# Patient Record
Sex: Male | Born: 1940 | Race: Black or African American | Hispanic: No | Smoking: Current every day smoker
Health system: Southern US, Community
[De-identification: ages and names within clinical notes are randomized; demographics above are authoritative.]

## PROBLEM LIST (undated history)

## (undated) DIAGNOSIS — C801 Malignant (primary) neoplasm, unspecified: Secondary | ICD-10-CM

## (undated) HISTORY — PX: TONSILLECTOMY: SUR1361

---

## 2016-01-23 HISTORY — PX: COLON SURGERY: SHX602

## 2017-05-28 ENCOUNTER — Encounter (HOSPITAL_COMMUNITY): Payer: Self-pay | Admitting: Emergency Medicine

## 2017-05-28 ENCOUNTER — Emergency Department (HOSPITAL_COMMUNITY)
Admission: EM | Admit: 2017-05-28 | Discharge: 2017-05-28 | Disposition: A | Discharge status: Home / Self Care | Payer: Medicare Other | Attending: Emergency Medicine | Admitting: Emergency Medicine

## 2017-05-28 DIAGNOSIS — E876 Hypokalemia: Secondary | ICD-10-CM | POA: Diagnosis not present

## 2017-05-28 DIAGNOSIS — Z85038 Personal history of other malignant neoplasm of large intestine: Secondary | ICD-10-CM | POA: Diagnosis not present

## 2017-05-28 DIAGNOSIS — Z79899 Other long term (current) drug therapy: Secondary | ICD-10-CM | POA: Diagnosis not present

## 2017-05-28 DIAGNOSIS — R001 Bradycardia, unspecified: Secondary | ICD-10-CM | POA: Insufficient documentation

## 2017-05-28 DIAGNOSIS — Z5181 Encounter for therapeutic drug level monitoring: Secondary | ICD-10-CM | POA: Diagnosis present

## 2017-05-28 HISTORY — DX: Malignant (primary) neoplasm, unspecified: C80.1

## 2017-05-28 LAB — MAGNESIUM: MAGNESIUM: 2.1 mg/dL (ref 1.7–2.4)

## 2017-05-28 LAB — BASIC METABOLIC PANEL
ANION GAP: 8 (ref 5–15)
BUN: 23 mg/dL — ABNORMAL HIGH (ref 6–20)
CHLORIDE: 113 mmol/L — AB (ref 101–111)
CO2: 24 mmol/L (ref 22–32)
Calcium: 8.6 mg/dL — ABNORMAL LOW (ref 8.9–10.3)
Creatinine, Ser: 1.14 mg/dL (ref 0.61–1.24)
GFR calc non Af Amer: >60 mL/min (ref >60)
Glucose, Bld: 90 mg/dL (ref 65–99)
POTASSIUM: 2.6 mmol/L — AB (ref 3.5–5.1)
SODIUM: 145 mmol/L (ref 135–145)

## 2017-05-28 MED ORDER — POTASSIUM CHLORIDE ER 20 MEQ PO TBCR: 20.0000 meq | EXTENDED_RELEASE_TABLET | Freq: Two times a day (BID) | ORAL | 0 refills | Status: AC

## 2017-05-28 MED ORDER — POTASSIUM CHLORIDE CRYS ER 20 MEQ PO TBCR
40.0000 meq | EXTENDED_RELEASE_TABLET | Freq: Once | ORAL | Status: AC
  Administered 2017-05-28: 40 meq via ORAL
  Filled 2017-05-28: qty 2

## 2017-05-28 MED ORDER — POTASSIUM CHLORIDE 10 MEQ/100ML IV SOLN
10.0000 meq | INTRAVENOUS | Status: AC
  Administered 2017-05-28: 10 meq via INTRAVENOUS
  Filled 2017-05-28: qty 100

## 2017-05-28 MED ORDER — HEPARIN SOD (PORK) LOCK FLUSH 100 UNIT/ML IV SOLN
500.0000 [IU] | Freq: Once | INTRAVENOUS | Status: AC
Start: 2017-05-28 — End: 2017-05-28
  Administered 2017-05-28: 500 [IU]
  Filled 2017-05-28: qty 5

## 2017-05-28 MED ORDER — POTASSIUM CHLORIDE 10 MEQ/100ML IV SOLN
10.0000 meq | Freq: Once | INTRAVENOUS | Status: AC
Start: 2017-05-28 — End: 2017-05-28
  Administered 2017-05-28: 10 meq via INTRAVENOUS
  Filled 2017-05-28: qty 100

## 2017-05-28 NOTE — ED Triage Notes (Signed)
Patient was seen at PCP last Friday and potassium was low and given potassium there but never got the potassium that was supposed to be ordered for at home. Since doesn't have a PCP in [place] they had to come here for potasium level to be checked. Family wanting recommendations for PCP in area.

## 2017-05-28 NOTE — ED Notes (Signed)
Bed: WA03 Expected date:  Expected time:  Means of arrival:  Comments: Triage 2 

## 2017-05-28 NOTE — ED Provider Notes (Signed)
[facility] DEPT Provider Note   CSN: [id number] Arrival date & time: 05/28/17  1014     History   Chief Complaint Chief Complaint  Patient presents with  . postassium level check    HPI Scott Harper is a 77 y.o. male.  HPI Patient has a history of colon cancer.  Presents for potassium recheck.  Reportedly was due to get chemotherapy at [facility] on Friday with today being Tuesday and he would not get done because his potassium was low.  Patient and family member did not know what the actual number was however.  States that he got some through the IV and then got 2 pills.  States he had a prescription for it but they went to the pharmacy and said it was never called in so he has not been taking more.  Does not have a primary care doctor here.  Patient states he feels fine.  No muscle cramps.  No lightheadedness or dizziness. Past Medical History:  Diagnosis Date  . Cancer [facility])    colon     There are no active problems to display for this patient.   History reviewed. No pertinent surgical history.      Home Medications    Prior to Admission medications   Medication Sig Start Date End Date Taking? Authorizing Provider  dexamethasone (DECADRON) 2 MG tablet Take 4 mg by mouth as directed. Take for 2 days after chemo   Yes [provider]  mirtazapine (REMERON) 30 MG tablet Take 30 mg by mouth at bedtime.   Yes [provider]  potassium chloride 20 MEQ TBCR Take 20 mEq by mouth 2 (two) times daily. 05/28/17   Davonna Belling, MD    Family History No family history on file.  Social History Social History   Tobacco Use  . Smoking status: Not on file  Substance Use Topics  . Alcohol use: Not on file  . Drug use: Not on file     Allergies   Patient has no known allergies.   Review of Systems Review of Systems  Constitutional: Negative for appetite change.  Respiratory: Negative for shortness of breath.     Cardiovascular: Negative for chest pain.  Gastrointestinal: Negative for abdominal pain.  Endocrine: Negative for polyuria.  Musculoskeletal: Negative for myalgias.  Skin: Negative for rash.     Physical Exam Updated Vital Signs BP (!) 178/87   Pulse (!) 38   Temp 98 F (36.7 C) (Oral)   Resp 14   SpO2 100%   Physical Exam  Constitutional: He appears well-developed.  HENT:  Head: Normocephalic.  Eyes: Pupils are equal, round, and reactive to light.  Neck: Neck supple.  Cardiovascular: Normal rate.  Pulmonary/Chest:  Port-A-Cath right chest wall.  Abdominal: Soft. There is no tenderness.  Musculoskeletal: He exhibits no edema.  Neurological: He is alert.  Skin: Skin is warm. Capillary refill takes less than 2 seconds.  Psychiatric: He has a normal mood and affect.     ED Treatments / Results  Labs (all labs ordered are listed, but only abnormal results are displayed) Labs Reviewed  BASIC METABOLIC PANEL - Abnormal; Notable for the following components:      Result Value   Potassium 2.6 (*)    Chloride 113 (*)    BUN 23 (*)    Calcium 8.6 (*)    All other components within normal limits  MAGNESIUM    EKG EKG Interpretation  Date/Time:  Tuesday May 28 2017 11:41:29 EDT Ventricular Rate:  39 PR Interval:    QRS Duration: 84 QT Interval:  526 QTC Calculation: 424 R Axis:   58 Text Interpretation:  Sinus bradycardia Anterior infarct, old Nonspecific T abnormalities, lateral leads No old tracing to compare Confirmed by Davonna Belling [phone]) on 05/28/2017 12:38:12 PM   Radiology No results found.  Procedures Procedures (including critical care time)  Medications Ordered in ED Medications  potassium chloride 10 mEq in 100 mL IVPB (10 mEq Intravenous New Bag/Given 05/28/17 1519)  potassium chloride SA (K-DUR,KLOR-CON) CR tablet 40 mEq (40 mEq Oral Given 05/28/17 1518)     Initial Impression / Assessment and Plan / ED Course  I have reviewed the triage  vital signs and the nursing notes.  Pertinent labs & imaging results that were available during my care of the patient were reviewed by me and considered in my medical decision making (see chart for details).     Patient with hypokalemia.  Has not had oral supplementation.  Given 2 rounds of IV and oral potassium.  Also given prescription for home.  Has bradycardia here.  Has been asymptomatic.  No lightheadedness or dizziness.  Not on any rate reducing medications.  States he has had slow heart rate in the past.  Doubt this is related to the hypokalemia.  Appears to be chronic.  Likely discharge home care will be turned over to Dr. Gilford Raid.  Final Clinical Impressions(s) / ED Diagnoses   Final diagnoses:  Hypokalemia  Sinus bradycardia    ED Discharge Orders        Ordered    potassium chloride 20 MEQ TBCR  2 times daily     05/28/17 1613       Davonna Belling, MD 05/28/17 1614

## 2017-05-28 NOTE — ED Provider Notes (Signed)
Pt signed out from Dr. Alvino Chapel pending potassium infusion.  Pt is bradycardic, but said he is always bradycardic and is asymptomatic from it.  The pt is feeling better after the potassium.  He has an appt with [facility] on Friday, the 10th.  He is given a rx for potassium.  Return if worse.   Isla Pence, MD 05/28/17 [phone]

## 2017-05-28 NOTE — ED Notes (Addendum)
Date and time results received: 05/28/17 11:34 AM  Test: Potassium Critical Value: 2.6  Name of Provider Notified: Alvino Chapel

## 2017-05-28 NOTE — Discharge Instructions (Signed)
Follow-up with your doctors for your potassium level.  It was 2.6 today.

## 2017-05-28 NOTE — ED Notes (Signed)
Made Dr pickering aware that wanting an update. Will be coming to speak with patient

## 2018-01-31 ENCOUNTER — Emergency Department (HOSPITAL_COMMUNITY)
Admission: EM | Admit: 2018-01-31 | Discharge: 2018-02-01 | Disposition: A | Discharge status: Home / Self Care | Payer: Medicare Other | Attending: Emergency Medicine | Admitting: Emergency Medicine

## 2018-01-31 ENCOUNTER — Encounter (HOSPITAL_COMMUNITY): Payer: Self-pay

## 2018-01-31 ENCOUNTER — Other Ambulatory Visit: Payer: Self-pay

## 2018-01-31 DIAGNOSIS — Z9049 Acquired absence of other specified parts of digestive tract: Secondary | ICD-10-CM | POA: Diagnosis not present

## 2018-01-31 DIAGNOSIS — C189 Malignant neoplasm of colon, unspecified: Secondary | ICD-10-CM | POA: Insufficient documentation

## 2018-01-31 DIAGNOSIS — Z452 Encounter for adjustment and management of vascular access device: Secondary | ICD-10-CM

## 2018-01-31 DIAGNOSIS — Z79899 Other long term (current) drug therapy: Secondary | ICD-10-CM | POA: Diagnosis not present

## 2018-01-31 DIAGNOSIS — C787 Secondary malignant neoplasm of liver and intrahepatic bile duct: Secondary | ICD-10-CM | POA: Diagnosis not present

## 2018-01-31 DIAGNOSIS — G629 Polyneuropathy, unspecified: Secondary | ICD-10-CM | POA: Diagnosis not present

## 2018-01-31 DIAGNOSIS — I10 Essential (primary) hypertension: Secondary | ICD-10-CM | POA: Diagnosis not present

## 2018-01-31 DIAGNOSIS — E860 Dehydration: Secondary | ICD-10-CM | POA: Diagnosis not present

## 2018-01-31 DIAGNOSIS — C19 Malignant neoplasm of rectosigmoid junction: Secondary | ICD-10-CM | POA: Diagnosis not present

## 2018-01-31 DIAGNOSIS — R109 Unspecified abdominal pain: Secondary | ICD-10-CM | POA: Diagnosis not present

## 2018-01-31 DIAGNOSIS — Z5111 Encounter for antineoplastic chemotherapy: Secondary | ICD-10-CM | POA: Diagnosis not present

## 2018-01-31 DIAGNOSIS — E876 Hypokalemia: Secondary | ICD-10-CM | POA: Diagnosis not present

## 2018-01-31 NOTE — Discharge Instructions (Addendum)
Return to the emergency department have any issues with your Port-A-Cath.

## 2018-01-31 NOTE — ED Notes (Signed)
Bed: WTR5 Expected date:  Expected time:  Means of arrival:  Comments: 

## 2018-01-31 NOTE — ED Triage Notes (Addendum)
Pt reports that he left the cancer center earlier today and about 1 hour ago started bleeding from his port. Blood noted in the line. IV team consulted. No pain or complaints.

## 2018-01-31 NOTE — Progress Notes (Signed)
Patient has his port accessed for continuous chemotherapy via [employer] infusion pump for home that was started today 01/31/18 at [facility] where the patient is seen by [facility]. The home infusion was connected to the ports lumen by a blue valve that had become malfunctioned and leaking. Blood was backed up in port tubing. The blue valve was disconnected from the patient and attempted to be flushed but was unable to due to a clot that was formed inside the valve. The valve and attachment piece was placed in a double biohazard bag and given to the patient. Port was flush with no resistance or discomfort to the patient. Pump infusion line was connected to the port line and the home pump was restarted. Pt and family instructed to follow-up with [facility]. [facility] was attempted to be contacted with no success. Triage RN made aware.  Lerry Liner, RN,VAST

## 2018-01-31 NOTE — ED Notes (Addendum)
Went into room with IV team. Device attached to the line that, when this writer attempted to flush it, noted there is a clot in it causing the leaking. Small device removed and IV team hooked chemo pump directly to port line. Running normally now. IV team to contact [facility] for more information.

## 2018-01-31 NOTE — ED Provider Notes (Signed)
[facility] DEPT Provider Note   CSN: [id number] Arrival date & time: 01/31/18  2151     History   Chief Complaint Chief Complaint  Patient presents with  . Vascular Access Problem    HPI Scott Harper is a 78 y.o. male.  He was currently being treated at [facility] with parenteral antibiotics.  He came in today because he was bleeding from his port.  The IV team assessed it and took off a piece that was moderate and causing him to bleed.  Bleeding has resolved and his chemotherapy is running appropriately.  No other complaints at this time  HPI  Past Medical History:  Diagnosis Date  . Cancer [facility])    colon     There are no active problems to display for this patient.   History reviewed. No pertinent surgical history.      Home Medications    Prior to Admission medications   Medication Sig Start Date End Date Taking? Authorizing Provider  dexamethasone (DECADRON) 2 MG tablet Take 4 mg by mouth as directed. Take for 2 days after chemo    [provider]  mirtazapine (REMERON) 30 MG tablet Take 30 mg by mouth at bedtime.    [provider]  potassium chloride 20 MEQ TBCR Take 20 mEq by mouth 2 (two) times daily. 05/28/17   Davonna Belling, MD    Family History History reviewed. No pertinent family history.  Social History Social History   Tobacco Use  . Smoking status: Not on file  Substance Use Topics  . Alcohol use: Not on file  . Drug use: Not on file     Allergies   Patient has no known allergies.   Review of Systems Review of Systems Ten systems reviewed and are negative for acute change, except as noted in the HPI.    Physical Exam Updated Vital Signs BP (!) 163/91 (BP Location: Right Arm)   Pulse 60   Temp 98 F (36.7 C) (Oral)   Resp 15   Ht 5\' 8"  (1.727 m)   Wt 59 kg   SpO2 100%   BMI 19.77 kg/m   Physical Exam Vitals signs and nursing note reviewed.  Constitutional:      General: He is  not in acute distress.    Appearance: He is well-developed. He is not diaphoretic.  HENT:     Head: Normocephalic and atraumatic.  Eyes:     General: No scleral icterus.    Conjunctiva/sclera: Conjunctivae normal.  Neck:     Musculoskeletal: Normal range of motion and neck supple.  Cardiovascular:     Rate and Rhythm: Normal rate and regular rhythm.     Heart sounds: Normal heart sounds.  Pulmonary:     Effort: Pulmonary effort is normal. No respiratory distress.     Breath sounds: Normal breath sounds.     Comments: Order cath in the right subclavian region.  No active bleeding.  Appears to be functioning appropriately Abdominal:     Palpations: Abdomen is soft.     Tenderness: There is no abdominal tenderness.  Skin:    General: Skin is warm and dry.  Neurological:     Mental Status: He is alert.  Psychiatric:        Behavior: Behavior normal.      ED Treatments / Results  Labs (all labs ordered are listed, but only abnormal results are displayed) Labs Reviewed - No data to display  EKG None  Radiology  No results found.  Procedures Procedures (including critical care time)  Medications Ordered in ED Medications - No data to display   Initial Impression / Assessment and Plan / ED Course  I have reviewed the triage vital signs and the nursing notes.  Pertinent labs & imaging results that were available during my care of the patient were reviewed by me and considered in my medical decision making (see chart for details).     The cath, resolved after intervention.  His chemotherapy agents are running appropriately he may follow-up with [facility].  Discussed return precautions.  Final Clinical Impressions(s) / ED Diagnoses   Final diagnoses:  Encounter for care related to [facility]    ED Discharge Orders    None       Margarita Mail, PA-C 02/01/18 1224    Tegeler, Gwenyth Allegra, MD 02/01/18 1046

## 2018-02-14 DIAGNOSIS — E876 Hypokalemia: Secondary | ICD-10-CM | POA: Diagnosis not present

## 2018-02-14 DIAGNOSIS — R109 Unspecified abdominal pain: Secondary | ICD-10-CM | POA: Diagnosis not present

## 2018-02-14 DIAGNOSIS — C189 Malignant neoplasm of colon, unspecified: Secondary | ICD-10-CM | POA: Diagnosis not present

## 2018-02-14 DIAGNOSIS — Z9049 Acquired absence of other specified parts of digestive tract: Secondary | ICD-10-CM | POA: Diagnosis not present

## 2018-02-14 DIAGNOSIS — C182 Malignant neoplasm of ascending colon: Secondary | ICD-10-CM | POA: Diagnosis not present

## 2018-02-14 DIAGNOSIS — G629 Polyneuropathy, unspecified: Secondary | ICD-10-CM | POA: Diagnosis not present

## 2018-02-14 DIAGNOSIS — I10 Essential (primary) hypertension: Secondary | ICD-10-CM | POA: Diagnosis not present

## 2018-02-14 DIAGNOSIS — G620 Drug-induced polyneuropathy: Secondary | ICD-10-CM | POA: Diagnosis not present

## 2018-02-14 DIAGNOSIS — T451X5A Adverse effect of antineoplastic and immunosuppressive drugs, initial encounter: Secondary | ICD-10-CM | POA: Diagnosis not present

## 2018-02-14 DIAGNOSIS — C787 Secondary malignant neoplasm of liver and intrahepatic bile duct: Secondary | ICD-10-CM | POA: Diagnosis not present

## 2018-02-14 DIAGNOSIS — Z5111 Encounter for antineoplastic chemotherapy: Secondary | ICD-10-CM | POA: Diagnosis not present

## 2018-03-07 DIAGNOSIS — R079 Chest pain, unspecified: Secondary | ICD-10-CM | POA: Diagnosis not present

## 2018-03-07 DIAGNOSIS — Z9049 Acquired absence of other specified parts of digestive tract: Secondary | ICD-10-CM | POA: Diagnosis not present

## 2018-03-07 DIAGNOSIS — C787 Secondary malignant neoplasm of liver and intrahepatic bile duct: Secondary | ICD-10-CM | POA: Diagnosis not present

## 2018-03-07 DIAGNOSIS — E876 Hypokalemia: Secondary | ICD-10-CM | POA: Diagnosis not present

## 2018-03-07 DIAGNOSIS — R918 Other nonspecific abnormal finding of lung field: Secondary | ICD-10-CM | POA: Diagnosis not present

## 2018-03-07 DIAGNOSIS — Z79899 Other long term (current) drug therapy: Secondary | ICD-10-CM | POA: Diagnosis not present

## 2018-03-07 DIAGNOSIS — C189 Malignant neoplasm of colon, unspecified: Secondary | ICD-10-CM | POA: Diagnosis not present

## 2018-03-07 DIAGNOSIS — M549 Dorsalgia, unspecified: Secondary | ICD-10-CM | POA: Diagnosis not present

## 2018-03-07 DIAGNOSIS — R109 Unspecified abdominal pain: Secondary | ICD-10-CM | POA: Diagnosis not present

## 2018-03-07 DIAGNOSIS — I10 Essential (primary) hypertension: Secondary | ICD-10-CM | POA: Diagnosis not present

## 2018-03-07 DIAGNOSIS — C19 Malignant neoplasm of rectosigmoid junction: Secondary | ICD-10-CM | POA: Diagnosis not present

## 2018-03-07 DIAGNOSIS — G629 Polyneuropathy, unspecified: Secondary | ICD-10-CM | POA: Diagnosis not present

## 2018-03-07 DIAGNOSIS — R590 Localized enlarged lymph nodes: Secondary | ICD-10-CM | POA: Diagnosis not present

## 2018-03-21 DIAGNOSIS — Z515 Encounter for palliative care: Secondary | ICD-10-CM | POA: Diagnosis not present

## 2018-03-21 DIAGNOSIS — Z79899 Other long term (current) drug therapy: Secondary | ICD-10-CM | POA: Diagnosis not present

## 2018-03-21 DIAGNOSIS — G620 Drug-induced polyneuropathy: Secondary | ICD-10-CM | POA: Diagnosis not present

## 2018-03-21 DIAGNOSIS — R590 Localized enlarged lymph nodes: Secondary | ICD-10-CM | POA: Diagnosis not present

## 2018-03-21 DIAGNOSIS — E876 Hypokalemia: Secondary | ICD-10-CM | POA: Diagnosis not present

## 2018-03-21 DIAGNOSIS — Z6821 Body mass index (BMI) 21.0-21.9, adult: Secondary | ICD-10-CM | POA: Diagnosis not present

## 2018-03-21 DIAGNOSIS — C189 Malignant neoplasm of colon, unspecified: Secondary | ICD-10-CM | POA: Diagnosis not present

## 2018-03-21 DIAGNOSIS — Z5111 Encounter for antineoplastic chemotherapy: Secondary | ICD-10-CM | POA: Diagnosis not present

## 2018-03-21 DIAGNOSIS — T402X5A Adverse effect of other opioids, initial encounter: Secondary | ICD-10-CM | POA: Diagnosis not present

## 2018-03-21 DIAGNOSIS — R918 Other nonspecific abnormal finding of lung field: Secondary | ICD-10-CM | POA: Diagnosis not present

## 2018-03-21 DIAGNOSIS — C787 Secondary malignant neoplasm of liver and intrahepatic bile duct: Secondary | ICD-10-CM | POA: Diagnosis not present

## 2018-03-21 DIAGNOSIS — Z9049 Acquired absence of other specified parts of digestive tract: Secondary | ICD-10-CM | POA: Diagnosis not present

## 2018-03-21 DIAGNOSIS — I10 Essential (primary) hypertension: Secondary | ICD-10-CM | POA: Diagnosis not present

## 2018-03-21 DIAGNOSIS — C19 Malignant neoplasm of rectosigmoid junction: Secondary | ICD-10-CM | POA: Diagnosis not present

## 2018-03-21 DIAGNOSIS — K5903 Drug induced constipation: Secondary | ICD-10-CM | POA: Diagnosis not present

## 2018-03-21 DIAGNOSIS — G893 Neoplasm related pain (acute) (chronic): Secondary | ICD-10-CM | POA: Diagnosis not present

## 2018-03-21 DIAGNOSIS — T451X5A Adverse effect of antineoplastic and immunosuppressive drugs, initial encounter: Secondary | ICD-10-CM | POA: Diagnosis not present

## 2018-03-23 DIAGNOSIS — C189 Malignant neoplasm of colon, unspecified: Secondary | ICD-10-CM | POA: Diagnosis not present

## 2018-04-04 DIAGNOSIS — E876 Hypokalemia: Secondary | ICD-10-CM | POA: Diagnosis not present

## 2018-04-04 DIAGNOSIS — C787 Secondary malignant neoplasm of liver and intrahepatic bile duct: Secondary | ICD-10-CM | POA: Diagnosis not present

## 2018-04-04 DIAGNOSIS — C189 Malignant neoplasm of colon, unspecified: Secondary | ICD-10-CM | POA: Diagnosis not present

## 2018-04-04 DIAGNOSIS — M542 Cervicalgia: Secondary | ICD-10-CM | POA: Diagnosis not present

## 2018-04-04 DIAGNOSIS — C19 Malignant neoplasm of rectosigmoid junction: Secondary | ICD-10-CM | POA: Diagnosis not present

## 2018-04-04 DIAGNOSIS — R7989 Other specified abnormal findings of blood chemistry: Secondary | ICD-10-CM | POA: Diagnosis not present

## 2018-04-04 DIAGNOSIS — Z87891 Personal history of nicotine dependence: Secondary | ICD-10-CM | POA: Diagnosis not present

## 2018-04-04 DIAGNOSIS — K5900 Constipation, unspecified: Secondary | ICD-10-CM | POA: Diagnosis not present

## 2018-04-04 DIAGNOSIS — Z9049 Acquired absence of other specified parts of digestive tract: Secondary | ICD-10-CM | POA: Diagnosis not present

## 2018-04-04 DIAGNOSIS — Z5111 Encounter for antineoplastic chemotherapy: Secondary | ICD-10-CM | POA: Diagnosis not present

## 2018-04-04 DIAGNOSIS — G629 Polyneuropathy, unspecified: Secondary | ICD-10-CM | POA: Diagnosis not present

## 2018-04-06 DIAGNOSIS — C189 Malignant neoplasm of colon, unspecified: Secondary | ICD-10-CM | POA: Diagnosis not present

## 2018-04-18 DIAGNOSIS — C787 Secondary malignant neoplasm of liver and intrahepatic bile duct: Secondary | ICD-10-CM | POA: Diagnosis not present

## 2018-04-18 DIAGNOSIS — Z5111 Encounter for antineoplastic chemotherapy: Secondary | ICD-10-CM | POA: Diagnosis not present

## 2018-04-18 DIAGNOSIS — C189 Malignant neoplasm of colon, unspecified: Secondary | ICD-10-CM | POA: Diagnosis not present

## 2018-04-20 DIAGNOSIS — C189 Malignant neoplasm of colon, unspecified: Secondary | ICD-10-CM | POA: Diagnosis not present

## 2018-05-09 DIAGNOSIS — Z5111 Encounter for antineoplastic chemotherapy: Secondary | ICD-10-CM | POA: Diagnosis not present

## 2018-05-09 DIAGNOSIS — C189 Malignant neoplasm of colon, unspecified: Secondary | ICD-10-CM | POA: Diagnosis not present

## 2018-05-09 DIAGNOSIS — C787 Secondary malignant neoplasm of liver and intrahepatic bile duct: Secondary | ICD-10-CM | POA: Diagnosis not present

## 2018-05-11 DIAGNOSIS — C189 Malignant neoplasm of colon, unspecified: Secondary | ICD-10-CM | POA: Diagnosis not present

## 2018-06-06 DIAGNOSIS — R109 Unspecified abdominal pain: Secondary | ICD-10-CM | POA: Diagnosis not present

## 2018-06-06 DIAGNOSIS — Z9049 Acquired absence of other specified parts of digestive tract: Secondary | ICD-10-CM | POA: Diagnosis not present

## 2018-06-06 DIAGNOSIS — C189 Malignant neoplasm of colon, unspecified: Secondary | ICD-10-CM | POA: Diagnosis not present

## 2018-06-06 DIAGNOSIS — E876 Hypokalemia: Secondary | ICD-10-CM | POA: Diagnosis not present

## 2018-06-06 DIAGNOSIS — Z5111 Encounter for antineoplastic chemotherapy: Secondary | ICD-10-CM | POA: Diagnosis not present

## 2018-06-06 DIAGNOSIS — G620 Drug-induced polyneuropathy: Secondary | ICD-10-CM | POA: Diagnosis not present

## 2018-06-06 DIAGNOSIS — I10 Essential (primary) hypertension: Secondary | ICD-10-CM | POA: Diagnosis not present

## 2018-06-06 DIAGNOSIS — G629 Polyneuropathy, unspecified: Secondary | ICD-10-CM | POA: Diagnosis not present

## 2018-06-06 DIAGNOSIS — K5900 Constipation, unspecified: Secondary | ICD-10-CM | POA: Diagnosis not present

## 2018-06-06 DIAGNOSIS — T451X5A Adverse effect of antineoplastic and immunosuppressive drugs, initial encounter: Secondary | ICD-10-CM | POA: Diagnosis not present

## 2018-06-06 DIAGNOSIS — C787 Secondary malignant neoplasm of liver and intrahepatic bile duct: Secondary | ICD-10-CM | POA: Diagnosis not present

## 2018-06-08 DIAGNOSIS — C189 Malignant neoplasm of colon, unspecified: Secondary | ICD-10-CM | POA: Diagnosis not present

## 2018-06-18 DIAGNOSIS — H25813 Combined forms of age-related cataract, bilateral: Secondary | ICD-10-CM | POA: Diagnosis not present

## 2018-06-20 DIAGNOSIS — C787 Secondary malignant neoplasm of liver and intrahepatic bile duct: Secondary | ICD-10-CM | POA: Diagnosis not present

## 2018-06-20 DIAGNOSIS — Z5111 Encounter for antineoplastic chemotherapy: Secondary | ICD-10-CM | POA: Diagnosis not present

## 2018-06-20 DIAGNOSIS — C189 Malignant neoplasm of colon, unspecified: Secondary | ICD-10-CM | POA: Diagnosis not present

## 2018-06-22 DIAGNOSIS — C189 Malignant neoplasm of colon, unspecified: Secondary | ICD-10-CM | POA: Diagnosis not present

## 2018-07-04 DIAGNOSIS — R51 Headache: Secondary | ICD-10-CM | POA: Diagnosis not present

## 2018-07-04 DIAGNOSIS — Z5111 Encounter for antineoplastic chemotherapy: Secondary | ICD-10-CM | POA: Diagnosis not present

## 2018-07-04 DIAGNOSIS — C787 Secondary malignant neoplasm of liver and intrahepatic bile duct: Secondary | ICD-10-CM | POA: Diagnosis not present

## 2018-07-04 DIAGNOSIS — C189 Malignant neoplasm of colon, unspecified: Secondary | ICD-10-CM | POA: Diagnosis not present

## 2018-07-06 DIAGNOSIS — C189 Malignant neoplasm of colon, unspecified: Secondary | ICD-10-CM | POA: Diagnosis not present

## 2018-07-17 ENCOUNTER — Telehealth: Payer: Self-pay

## 2018-07-18 DIAGNOSIS — K5900 Constipation, unspecified: Secondary | ICD-10-CM | POA: Diagnosis not present

## 2018-07-18 DIAGNOSIS — Z66 Do not resuscitate: Secondary | ICD-10-CM | POA: Diagnosis not present

## 2018-07-18 DIAGNOSIS — C189 Malignant neoplasm of colon, unspecified: Secondary | ICD-10-CM | POA: Diagnosis not present

## 2018-07-18 DIAGNOSIS — C787 Secondary malignant neoplasm of liver and intrahepatic bile duct: Secondary | ICD-10-CM | POA: Diagnosis not present

## 2018-07-18 DIAGNOSIS — Z79899 Other long term (current) drug therapy: Secondary | ICD-10-CM | POA: Diagnosis not present

## 2018-07-18 DIAGNOSIS — I10 Essential (primary) hypertension: Secondary | ICD-10-CM | POA: Diagnosis not present

## 2018-07-18 DIAGNOSIS — R918 Other nonspecific abnormal finding of lung field: Secondary | ICD-10-CM | POA: Diagnosis not present

## 2018-07-18 DIAGNOSIS — C19 Malignant neoplasm of rectosigmoid junction: Secondary | ICD-10-CM | POA: Diagnosis not present

## 2018-07-18 DIAGNOSIS — R160 Hepatomegaly, not elsewhere classified: Secondary | ICD-10-CM | POA: Diagnosis not present

## 2018-07-18 DIAGNOSIS — Z9049 Acquired absence of other specified parts of digestive tract: Secondary | ICD-10-CM | POA: Diagnosis not present

## 2018-07-18 DIAGNOSIS — E876 Hypokalemia: Secondary | ICD-10-CM | POA: Diagnosis not present

## 2018-07-18 DIAGNOSIS — R51 Headache: Secondary | ICD-10-CM | POA: Diagnosis not present

## 2018-07-18 DIAGNOSIS — G629 Polyneuropathy, unspecified: Secondary | ICD-10-CM | POA: Diagnosis not present

## 2018-07-22 ENCOUNTER — Other Ambulatory Visit: Payer: Self-pay

## 2018-07-22 NOTE — Patient Outreach (Signed)
[facility]) Care Management  07/22/2018  Demarri Elie 1940-07-11 [id number]  Screening Referral date: 07/17/18 Referral reason: help finding a primary care provider Insurance: [employer] care Attempt #1  Telephone call to patient regarding referral. HIPAA verified with patient.  Patient states his nurse is with him now and he request return call from [facility] at a later times.     PLAN:  RNCm will attempt 2nd telephone call to patient within 4 business days.  RNCM will send outreach letter to patient  [name] RN,BSN,CCM [facility] Telephonic  [phone]

## 2018-07-24 ENCOUNTER — Other Ambulatory Visit: Payer: Self-pay

## 2018-07-24 NOTE — Patient Outreach (Signed)
[facility]) Care Management  07/24/2018  Scott Harper 1940/08/22 [id number]  Screening Referral date: 07/17/18 Referral reason: help finding a primary care provider Insurance: [employer] care Attempt #2  Telephone call to patient regarding referral. Unable to reach patient. HIPAA compliant voice message left with call back phone number.   PLAN:   RNCm will attempt 3rd telephone call to patient within 4 business days.   [name] RN,BSN,CCM [facility] Telephonic  [phone]

## 2018-07-31 ENCOUNTER — Ambulatory Visit: Payer: Self-pay

## 2018-08-04 ENCOUNTER — Other Ambulatory Visit: Payer: Self-pay

## 2018-08-04 NOTE — Patient Outreach (Signed)
[facility]) Care Management  08/04/2018  Scott Harper Dec 30, 1940 [id number]  Screening Referral date:07/17/18 Referral reason:help finding a primary care provider Insurance: [employer] health care Attempt #3  Telephone call to patient regarding referral. Unable to reach patient. HIPAA compliant voice message left with call back phone number.  PLAN; If no response will proceed with closure.   [name] RN,BSN,CCM [facility] Telephonic  [phone]

## 2018-08-05 ENCOUNTER — Other Ambulatory Visit: Payer: Self-pay

## 2018-08-05 NOTE — Patient Outreach (Signed)
[facility]) Care Management  08/05/2018  Beecher Furio [birth year] [id number]  Case closure Referral date:07/17/18 Referral reason:help finding a primary care provider Insurance: [employer] health care  No response after 3 telephone calls and outreach letter attempt.  PLAN: RNCM will close patient due to being unable to reach.  RNCM will send closure notification to patient's primary MD   [name] RN,BSN,CCM [facility] Telephonic  [phone]

## 2018-09-26 ENCOUNTER — Emergency Department (HOSPITAL_COMMUNITY): Payer: Medicare Other

## 2018-09-26 ENCOUNTER — Observation Stay (HOSPITAL_COMMUNITY): Payer: Medicare Other

## 2018-09-26 ENCOUNTER — Other Ambulatory Visit: Payer: Self-pay

## 2018-09-26 ENCOUNTER — Encounter (HOSPITAL_COMMUNITY): Payer: Self-pay | Admitting: Emergency Medicine

## 2018-09-26 ENCOUNTER — Inpatient Hospital Stay (HOSPITAL_COMMUNITY)
Admission: EM | Admit: 2018-09-26 | Discharge: 2018-09-28 | DRG: 175 | Disposition: A | Discharge status: Hospice | Payer: Medicare Other | Attending: Internal Medicine | Admitting: Internal Medicine

## 2018-09-26 DIAGNOSIS — Z79891 Long term (current) use of opiate analgesic: Secondary | ICD-10-CM | POA: Diagnosis not present

## 2018-09-26 DIAGNOSIS — I361 Nonrheumatic tricuspid (valve) insufficiency: Secondary | ICD-10-CM | POA: Diagnosis not present

## 2018-09-26 DIAGNOSIS — I2609 Other pulmonary embolism with acute cor pulmonale: Secondary | ICD-10-CM | POA: Diagnosis not present

## 2018-09-26 DIAGNOSIS — J189 Pneumonia, unspecified organism: Secondary | ICD-10-CM | POA: Diagnosis present

## 2018-09-26 DIAGNOSIS — Z85038 Personal history of other malignant neoplasm of large intestine: Secondary | ICD-10-CM

## 2018-09-26 DIAGNOSIS — F17210 Nicotine dependence, cigarettes, uncomplicated: Secondary | ICD-10-CM | POA: Diagnosis present

## 2018-09-26 DIAGNOSIS — Z9221 Personal history of antineoplastic chemotherapy: Secondary | ICD-10-CM | POA: Diagnosis not present

## 2018-09-26 DIAGNOSIS — E876 Hypokalemia: Secondary | ICD-10-CM | POA: Diagnosis not present

## 2018-09-26 DIAGNOSIS — Z515 Encounter for palliative care: Secondary | ICD-10-CM | POA: Diagnosis not present

## 2018-09-26 DIAGNOSIS — I2699 Other pulmonary embolism without acute cor pulmonale: Principal | ICD-10-CM | POA: Diagnosis present

## 2018-09-26 DIAGNOSIS — Z20828 Contact with and (suspected) exposure to other viral communicable diseases: Secondary | ICD-10-CM | POA: Diagnosis present

## 2018-09-26 DIAGNOSIS — J181 Lobar pneumonia, unspecified organism: Secondary | ICD-10-CM | POA: Diagnosis not present

## 2018-09-26 DIAGNOSIS — N183 Chronic kidney disease, stage 3 unspecified: Secondary | ICD-10-CM

## 2018-09-26 DIAGNOSIS — N179 Acute kidney failure, unspecified: Secondary | ICD-10-CM | POA: Diagnosis not present

## 2018-09-26 DIAGNOSIS — R079 Chest pain, unspecified: Secondary | ICD-10-CM | POA: Diagnosis not present

## 2018-09-26 DIAGNOSIS — Z7901 Long term (current) use of anticoagulants: Secondary | ICD-10-CM

## 2018-09-26 DIAGNOSIS — Z66 Do not resuscitate: Secondary | ICD-10-CM | POA: Diagnosis not present

## 2018-09-26 DIAGNOSIS — R0602 Shortness of breath: Secondary | ICD-10-CM | POA: Diagnosis not present

## 2018-09-26 DIAGNOSIS — Z79899 Other long term (current) drug therapy: Secondary | ICD-10-CM

## 2018-09-26 DIAGNOSIS — C787 Secondary malignant neoplasm of liver and intrahepatic bile duct: Secondary | ICD-10-CM | POA: Diagnosis not present

## 2018-09-26 DIAGNOSIS — C799 Secondary malignant neoplasm of unspecified site: Secondary | ICD-10-CM

## 2018-09-26 DIAGNOSIS — G8929 Other chronic pain: Secondary | ICD-10-CM | POA: Diagnosis not present

## 2018-09-26 DIAGNOSIS — D638 Anemia in other chronic diseases classified elsewhere: Secondary | ICD-10-CM | POA: Diagnosis not present

## 2018-09-26 DIAGNOSIS — I129 Hypertensive chronic kidney disease with stage 1 through stage 4 chronic kidney disease, or unspecified chronic kidney disease: Secondary | ICD-10-CM | POA: Diagnosis present

## 2018-09-26 LAB — FIBRINOGEN: Fibrinogen: 545 mg/dL — ABNORMAL HIGH (ref 210–475)

## 2018-09-26 LAB — BASIC METABOLIC PANEL
Anion gap: 11 (ref 5–15)
BUN: 17 mg/dL (ref 8–23)
CO2: 21 mmol/L — ABNORMAL LOW (ref 22–32)
Calcium: 9.4 mg/dL (ref 8.9–10.3)
Chloride: 106 mmol/L (ref 98–111)
Creatinine, Ser: 1.42 mg/dL — ABNORMAL HIGH (ref 0.61–1.24)
GFR calc Af Amer: 55 mL/min — ABNORMAL LOW (ref >60)
GFR calc non Af Amer: 47 mL/min — ABNORMAL LOW (ref >60)
Glucose, Bld: 104 mg/dL — ABNORMAL HIGH (ref 70–99)
Potassium: 3.8 mmol/L (ref 3.5–5.1)
Sodium: 138 mmol/L (ref 135–145)

## 2018-09-26 LAB — URINALYSIS, ROUTINE W REFLEX MICROSCOPIC
Bacteria, UA: NONE SEEN
Bilirubin Urine: NEGATIVE
Glucose, UA: NEGATIVE mg/dL
Hgb urine dipstick: NEGATIVE
Ketones, ur: 5 mg/dL — AB
Leukocytes,Ua: NEGATIVE
Nitrite: NEGATIVE
Protein, ur: 100 mg/dL — AB
Specific Gravity, Urine: 1.018 (ref 1.005–1.030)
pH: 5 (ref 5.0–8.0)

## 2018-09-26 LAB — HEPATIC FUNCTION PANEL
ALT: 33 U/L (ref 0–44)
AST: 43 U/L — ABNORMAL HIGH (ref 15–41)
Albumin: 3.7 g/dL (ref 3.5–5.0)
Alkaline Phosphatase: 139 U/L — ABNORMAL HIGH (ref 38–126)
Bilirubin, Direct: 0.6 mg/dL — ABNORMAL HIGH (ref 0.0–0.2)
Indirect Bilirubin: 1.3 mg/dL — ABNORMAL HIGH (ref 0.3–0.9)
Total Bilirubin: 1.9 mg/dL — ABNORMAL HIGH (ref 0.3–1.2)
Total Protein: 7 g/dL (ref 6.5–8.1)

## 2018-09-26 LAB — CBC
HCT: 39.9 % (ref 39.0–52.0)
Hemoglobin: 13.3 g/dL (ref 13.0–17.0)
MCH: 30.4 pg (ref 26.0–34.0)
MCHC: 33.3 g/dL (ref 30.0–36.0)
MCV: 91.3 fL (ref 80.0–100.0)
Platelets: 145 10*3/uL — ABNORMAL LOW (ref 150–400)
RBC: 4.37 MIL/uL (ref 4.22–5.81)
RDW: 15.1 % (ref 11.5–15.5)
WBC: 11.4 10*3/uL — ABNORMAL HIGH (ref 4.0–10.5)
nRBC: 0 % (ref 0.0–0.2)

## 2018-09-26 LAB — TROPONIN I (HIGH SENSITIVITY): Troponin I (High Sensitivity): 6 ng/L (ref <18)

## 2018-09-26 LAB — LACTIC ACID, PLASMA: Lactic Acid, Venous: 1.6 mmol/L (ref 0.5–1.9)

## 2018-09-26 LAB — SARS CORONAVIRUS 2 BY RT PCR (HOSPITAL ORDER, PERFORMED IN ~~LOC~~ HOSPITAL LAB): SARS Coronavirus 2: NEGATIVE

## 2018-09-26 LAB — LACTATE DEHYDROGENASE: LDH: 303 U/L — ABNORMAL HIGH (ref 98–192)

## 2018-09-26 LAB — PROCALCITONIN: Procalcitonin: 0.44 ng/mL

## 2018-09-26 LAB — D-DIMER, QUANTITATIVE: D-Dimer, Quant: 6.9 ug/mL-FEU — ABNORMAL HIGH (ref 0.00–0.50)

## 2018-09-26 LAB — LIPASE, BLOOD: Lipase: 18 U/L (ref 11–51)

## 2018-09-26 LAB — FERRITIN: Ferritin: 155 ng/mL (ref 24–336)

## 2018-09-26 LAB — TRIGLYCERIDES: Triglycerides: 64 mg/dL (ref <150)

## 2018-09-26 LAB — C-REACTIVE PROTEIN: CRP: 18.6 mg/dL — ABNORMAL HIGH (ref <1.0)

## 2018-09-26 MED ORDER — HEPARIN BOLUS VIA INFUSION
3000.0000 [IU] | Freq: Once | INTRAVENOUS | Status: AC
  Administered 2018-09-27: 3000 [IU] via INTRAVENOUS
  Filled 2018-09-26: qty 3000

## 2018-09-26 MED ORDER — HEPARIN (PORCINE) 25000 UT/250ML-% IV SOLN
1100.0000 [IU]/h | INTRAVENOUS | Status: DC
  Administered 2018-09-27 (×2): 1100 [IU]/h via INTRAVENOUS
  Filled 2018-09-26 (×2): qty 250

## 2018-09-26 MED ORDER — ACETAMINOPHEN 500 MG PO TABS
1000.0000 mg | ORAL_TABLET | Freq: Once | ORAL | Status: AC
  Administered 2018-09-26: 1000 mg via ORAL
  Filled 2018-09-26: qty 2

## 2018-09-26 MED ORDER — SODIUM CHLORIDE 0.9 % IV SOLN
1.0000 g | Freq: Once | INTRAVENOUS | Status: AC
  Administered 2018-09-26: 1 g via INTRAVENOUS
  Filled 2018-09-26: qty 10

## 2018-09-26 MED ORDER — SODIUM CHLORIDE 0.9 % IV SOLN
500.0000 mg | Freq: Once | INTRAVENOUS | Status: AC
  Administered 2018-09-26: 500 mg via INTRAVENOUS
  Filled 2018-09-26: qty 500

## 2018-09-26 MED ORDER — IOHEXOL 350 MG/ML SOLN
100.0000 mL | Freq: Once | INTRAVENOUS | Status: AC | PRN
  Administered 2018-09-26: 100 mL via INTRAVENOUS

## 2018-09-26 NOTE — ED Notes (Signed)
ED TO INPATIENT HANDOFF REPORT  ED Nurse Name and Phone #: Lattie Haw [phone]  S Name/Age/Gender Scott Harper 78 y.o. male Room/Bed: WA14/WA14  Code Status   Code Status: Not on file  Home/SNF/Other Home Patient oriented to: self, place, time and situation Is this baseline? Yes   Triage Complete: Triage complete  Chief Complaint chest pain; shob  Triage Note Pt reports chest pains and SOB since last night.    Allergies No Known Allergies  Level of Care/Admitting Diagnosis ED Disposition    ED Disposition Condition Comment   Admit  Hospital Area: [facility] [[id number]]  Level of Care: Telemetry [5]  Admit to tele based on following criteria: Monitor for Ischemic changes  Covid Evaluation: Person Under Investigation (PUI)  Diagnosis: Acute pulmonary embolism [facility]) [id number]  Admitting Physician: Rise Patience [phone]  Attending Physician: Rise Patience [phone]  Estimated length of stay: past midnight tomorrow  Certification:: I certify this patient will need inpatient services for at least 2 midnights  PT Class (Do Not Modify): Inpatient [101]  PT Acc Code (Do Not Modify): Private [1]       B Medical/Surgery History Past Medical History:  Diagnosis Date  . Cancer [facility])    colon    Past Surgical History:  Procedure Laterality Date  . COLON SURGERY  2018  . TONSILLECTOMY       A IV Location/Drains/Wounds Patient Lines/Drains/Airways Status   Active Line/Drains/Airways    Name:   Placement date:   Placement time:   Site:   Days:   Implanted Port Right Chest   -    -    Chest      Peripheral IV 09/26/18 Left Forearm   09/26/18    1742    Forearm   less than 1          Intake/Output Last 24 hours No intake or output data in the 24 hours ending 09/26/18 2346  Labs/Imaging Results for orders placed or performed during the hospital encounter of 09/26/18 (from the past 48 hour(s))  Basic metabolic panel     Status: Abnormal   Collection Time: 09/26/18  5:38 PM  Result Value Ref Range   Sodium 138 135 - 145 mmol/L   Potassium 3.8 3.5 - 5.1 mmol/L   Chloride 106 98 - 111 mmol/L   CO2 21 (L) 22 - 32 mmol/L   Glucose, Bld 104 (H) 70 - 99 mg/dL   BUN 17 8 - 23 mg/dL   Creatinine, Ser 1.42 (H) 0.61 - 1.24 mg/dL   Calcium 9.4 8.9 - 10.3 mg/dL   GFR calc non Af Amer 47 (L) >60 mL/min   GFR calc Af Amer 55 (L) >60 mL/min   Anion gap 11 5 - 15    Comment: Performed at [facility], [place] [address], [place], [place] [address]  CBC     Status: Abnormal   Collection Time: 09/26/18  5:38 PM  Result Value Ref Range   WBC 11.4 (H) 4.0 - 10.5 K/uL   RBC 4.37 4.22 - 5.81 MIL/uL   Hemoglobin 13.3 13.0 - 17.0 g/dL   HCT 39.9 39.0 - 52.0 %   MCV 91.3 80.0 - 100.0 fL   MCH 30.4 26.0 - 34.0 pg   MCHC 33.3 30.0 - 36.0 g/dL   RDW 15.1 11.5 - 15.5 %   Platelets 145 (L) 150 - 400 K/uL   nRBC 0.0 0.0 - 0.2 %    Comment: Performed  at [facility], [place] [address] [place], [place] [address]  Troponin I (High Sensitivity)     Status: None   Collection Time: 09/26/18  5:38 PM  Result Value Ref Range   Troponin I (High Sensitivity) 6 <18 ng/L    Comment: (NOTE) Elevated high sensitivity troponin I (hsTnI) values and significant  changes across serial measurements may suggest ACS but many other  chronic and acute conditions are known to elevate hsTnI results.  Refer to the "Links" section for chest pain algorithms and additional  guidance. Performed at [facility], [place] [address], [place], [place] [address]   SARS Coronavirus 2 [facility] order, Performed in [facility] hospital lab) Nasopharyngeal Nasopharyngeal Swab     Status: None   Collection Time: 09/26/18  5:38 PM   Specimen: Nasopharyngeal Swab  Result Value Ref Range   SARS Coronavirus 2 NEGATIVE NEGATIVE    Comment: (NOTE) If result is NEGATIVE SARS-CoV-2 target nucleic acids are NOT DETECTED. The SARS-CoV-2  RNA is generally detectable in upper and lower  respiratory specimens during the acute phase of infection. The lowest  concentration of SARS-CoV-2 viral copies this assay can detect is 250  copies / mL. A negative result does not preclude SARS-CoV-2 infection  and should not be used as the sole basis for treatment or other  patient management decisions.  A negative result may occur with  improper specimen collection / handling, submission of specimen other  than nasopharyngeal swab, presence of viral mutation(s) within the  areas targeted by this assay, and inadequate number of viral copies  (<250 copies / mL). A negative result must be combined with clinical  observations, patient history, and epidemiological information. If result is POSITIVE SARS-CoV-2 target nucleic acids are DETECTED. The SARS-CoV-2 RNA is generally detectable in upper and lower  respiratory specimens dur ing the acute phase of infection.  Positive  results are indicative of active infection with SARS-CoV-2.  Clinical  correlation with patient history and other diagnostic information is  necessary to determine patient infection status.  Positive results do  not rule out bacterial infection or co-infection with other viruses. If result is PRESUMPTIVE POSTIVE SARS-CoV-2 nucleic acids MAY BE PRESENT.   A presumptive positive result was obtained on the submitted specimen  and confirmed on repeat testing.  While 2019 novel coronavirus  (SARS-CoV-2) nucleic acids may be present in the submitted sample  additional confirmatory testing may be necessary for epidemiological  and / or clinical management purposes  to differentiate between  SARS-CoV-2 and other Sarbecovirus currently known to infect humans.  If clinically indicated additional testing with an alternate test  methodology [phone]) is advised. The SARS-CoV-2 RNA is generally  detectable in upper and lower respiratory sp ecimens during the acute  phase of  infection. The expected result is Negative. Fact Sheet for Patients:  StrictlyIdeas.no Fact Sheet for Healthcare Providers: BankingDealers.co.za This test is not yet approved or cleared by the [place] FDA and has been authorized for detection and/or diagnosis of SARS-CoV-2 by FDA under an Emergency Use Authorization (EUA).  This EUA will remain in effect (meaning this test can be used) for the duration of the COVID-19 declaration under Section 564(b)(1) of the Act, 21 U.S.C. section 360bbb-3(b)(1), unless the authorization is terminated or revoked sooner. Performed at [facility], [place] [address], [place], [place] [address]   Lactic acid, plasma     Status: None   Collection Time: 09/26/18  5:38 PM  Result Value Ref Range   Lactic Acid, Venous 1.6 0.5 - 1.9 mmol/L    Comment: Performed at [facility], [place] [address] [place], [place] [address]  D-dimer, quantitative     Status: Abnormal   Collection Time: 09/26/18  5:38 PM  Result Value Ref Range   D-Dimer, Quant 6.90 (H) 0.00 - 0.50 ug/mL-FEU    Comment: (NOTE) At the manufacturer cut-off of 0.50 ug/mL FEU, this assay has been documented to exclude PE with a sensitivity and negative predictive value of 97 to 99%.  At this time, this assay has not been approved by the FDA to exclude DVT/VTE. Results should be correlated with clinical presentation. Performed at [facility], [place] [address], [place], [place] [address]   Procalcitonin     Status: None   Collection Time: 09/26/18  5:38 PM  Result Value Ref Range   Procalcitonin 0.44 ng/mL    Comment:        Interpretation: PCT (Procalcitonin) <= 0.5 ng/mL: Systemic infection (sepsis) is not likely. Local bacterial infection is possible. (NOTE)       Sepsis PCT Algorithm           Lower Respiratory Tract                                      Infection PCT Algorithm     ----------------------------     ----------------------------         PCT < 0.25 ng/mL                PCT < 0.10 ng/mL         Strongly encourage             Strongly discourage   discontinuation of antibiotics    initiation of antibiotics    ----------------------------     -----------------------------       PCT 0.25 - 0.50 ng/mL            PCT 0.10 - 0.25 ng/mL               OR       >80% decrease in PCT            Discourage initiation of                                            antibiotics      Encourage discontinuation           of antibiotics    ----------------------------     -----------------------------         PCT >= 0.50 ng/mL              PCT 0.26 - 0.50 ng/mL               AND        <80% decrease in PCT             Encourage initiation of                                             antibiotics       Encourage continuation  of antibiotics    ----------------------------     -----------------------------        PCT >= 0.50 ng/mL                  PCT > 0.50 ng/mL               AND         increase in PCT                  Strongly encourage                                      initiation of antibiotics    Strongly encourage escalation           of antibiotics                                     -----------------------------                                           PCT <= 0.25 ng/mL                                                 OR                                        > 80% decrease in PCT                                     Discontinue / Do not initiate                                             antibiotics Performed at [place] [address], [place], [place] [address]   Lactate dehydrogenase     Status: Abnormal   Collection Time: 09/26/18  5:38 PM  Result Value Ref Range   LDH 303 (H) 98 - 192 U/L    Comment: Performed at [facility], [place] [address] [place], [place] [address]  Ferritin     Status:  None   Collection Time: 09/26/18  5:38 PM  Result Value Ref Range   Ferritin 155 24 - 336 ng/mL    Comment: Performed at [facility], [place] [address] [place], [place] [address]  Triglycerides     Status: None   Collection Time: 09/26/18  5:38 PM  Result Value Ref Range   Triglycerides 64 <150 mg/dL    Comment: Performed at [facility], [place] [address] [place], [place] [address]  Fibrinogen     Status: Abnormal   Collection Time: 09/26/18  5:38 PM  Result Value Ref Range   Fibrinogen 545 (H) 210 - 475 mg/dL    Comment: Performed at [facility], [place] [address], [place], [place] [address]  C-reactive protein     Status: Abnormal   Collection Time: 09/26/18  5:38 PM  Result Value Ref Range   CRP 18.6 (H) <1.0 mg/dL    Comment: Performed at [facility], [place] [address] [place], [place] [address]  Urinalysis, Routine w reflex microscopic     Status: Abnormal   Collection Time: 09/26/18  5:38 PM  Result Value Ref Range   Color, Urine YELLOW YELLOW   APPearance CLEAR CLEAR   Specific Gravity, Urine 1.018 1.005 - 1.030   pH 5.0 5.0 - 8.0   Glucose, UA NEGATIVE NEGATIVE mg/dL   Hgb urine dipstick NEGATIVE NEGATIVE   Bilirubin Urine NEGATIVE NEGATIVE   Ketones, ur 5 (A) NEGATIVE mg/dL   Protein, ur 100 (A) NEGATIVE mg/dL   Nitrite NEGATIVE NEGATIVE   Leukocytes,Ua NEGATIVE NEGATIVE   RBC / HPF 0-5 0 - 5 RBC/hpf   WBC, UA 0-5 0 - 5 WBC/hpf   Bacteria, UA NONE SEEN NONE SEEN   Mucus PRESENT     Comment: Performed at [facility], [place] [address], [place], [place] [address]  Hepatic function panel     Status: Abnormal   Collection Time: 09/26/18  5:38 PM  Result Value Ref Range   Total Protein 7.0 6.5 - 8.1 g/dL   Albumin 3.7 3.5 - 5.0 g/dL   AST 43 (H) 15 - 41 U/L   ALT 33 0 - 44 U/L   Alkaline Phosphatase 139 (H) 38 - 126 U/L   Total Bilirubin 1.9 (H) 0.3 - 1.2 mg/dL   Bilirubin,  Direct 0.6 (H) 0.0 - 0.2 mg/dL   Indirect Bilirubin 1.3 (H) 0.3 - 0.9 mg/dL    Comment: Performed at [facility], [place] [address] [place], [place] [address]  Lipase, blood     Status: None   Collection Time: 09/26/18  5:38 PM  Result Value Ref Range   Lipase 18 11 - 51 U/L    Comment: Performed at [facility], [place] [address] [place], [place] [address]   Dg Chest 2 View  Result Date: 09/26/2018 CLINICAL DATA:  Pt reported chest pains and SOB since last night. Medical hx of colon cancer. Current smoker EXAM: CHEST - 2 VIEW COMPARISON:  None. FINDINGS: The patient has a RIGHT-sided PowerPort, tip overlying the level of the superior vena cava. Heart size is normal. Aorta is mildly tortuous. There are patchy streaky changes at the RIGHT lung base, consistent atelectasis and/or developing infiltrate. LEFT lung is clear. No pulmonary edema. IMPRESSION: Atelectasis and/or developing infiltrate at the RIGHT lung base. Electronically Signed   By: Nolon Nations M.D.   On: 09/26/2018 17:12   Ct Angio Chest Pe W And/or Wo Contrast  Result Date: 09/26/2018 CLINICAL DATA:  Chest pain EXAM: CT ANGIOGRAPHY CHEST WITH CONTRAST TECHNIQUE: Multidetector CT imaging of the chest was performed using the standard protocol during bolus administration of intravenous contrast. Multiplanar CT image reconstructions and MIPs were obtained to evaluate the vascular anatomy. CONTRAST:  151mL OMNIPAQUE IOHEXOL 350 MG/ML SOLN COMPARISON:  None. FINDINGS: Cardiovascular: Contrast injection is sufficient to demonstrate satisfactory opacification of the pulmonary arteries to the segmental level.There is an acute pulmonary embolus involving the left lower lobe pulmonary artery with extension into segmental and subsegmental branches. There are acute appearing subsegmental pulmonary emboli involving the right upper lobe. There are acute pulmonary emboli involving the segmental and subsegmental  branches of  the right lower lobe. The main pulmonary artery is not dilated. The heart size is mildly enlarged. There is evidence for right-sided heart strain with an RV/LV ratio measuring approximately 1. Coronary artery calcifications are noted. There is a well-positioned right-sided Port-A-Cath with tip terminating near the cavoatrial junction. Mediastinum/Nodes: --there is scattered calcified hilar and mediastinal lymph nodes. --No axillary lymphadenopathy. --No supraclavicular lymphadenopathy. --Normal thyroid gland. --The esophagus is unremarkable Lungs/Pleura: There are extensive bilateral pleural base nodules there are innumerable small pulmonary nodules throughout both lung fields. There is some architectural distortion of the upper lobes bilaterally. There is consolidation involving the right lower lobe. Upper Abdomen: There are innumerable low-attenuation masses throughout the liver consistent with metastatic disease. Musculoskeletal: No chest wall abnormality. No acute or significant osseous findings. Review of the MIP images confirms the above findings. IMPRESSION: 1. Bilateral acute pulmonary emboli as detailed above with CT evidence for right heart strain. Positive for acute PE with CTevidence of right heart strain (RV/LV Ratio = 1) consistent with at least submassive (intermediate risk) PE. The presence of right heart strain has been associated with an increased risk of morbidity and mortality. 2. Consolidation involving the right lower lobe concerning for pneumonia. A developing pulmonary infarct can have a similar appearance but seems less likely. 3. Innumerable bilateral pulmonary micro nodules and pleural based nodules as detailed above. Differential considerations include hematogenous metastases or an opportunistic infection. 4. Multiple low-attenuation masses throughout the liver consistent with metastatic disease. These results were called by telephone at the time of interpretation on 09/26/2018  at 9:58 pm to Dr. Lennice Sites , who verbally acknowledged these results. Electronically Signed   By: Constance Holster M.D.   On: 09/26/2018 22:03    Pending Labs Unresulted Labs (From admission, onward)    Start     Ordered   09/26/18 2221  APTT  ONCE - STAT,   STAT     09/26/18 2220   09/26/18 2221  Protime-INR  ONCE - STAT,   STAT     09/26/18 2220   09/26/18 2214  SARS Coronavirus 2 [facility] order, Performed in [facility] hospital lab) Nasopharyngeal Nasopharyngeal Swab  (Symptomatic/High Risk of Exposure/Tier 1 Patients Labs with Precautions)  ONCE - STAT,   STAT    Question Answer Comment  Is this test for diagnosis or screening Diagnosis of ill patient   Symptomatic for COVID-19 as defined by CDC Yes   Date of Symptom Onset 09/26/2018   Hospitalized for COVID-19 Yes   Admitted to ICU for COVID-19 No   Previously tested for COVID-19 Yes   Resident in a congregate (group) care setting No   Employed in healthcare setting No      09/26/18 2213   09/26/18 1724  Urine culture  ONCE - STAT,   STAT     09/26/18 1723   09/26/18 1722  Lactic acid, plasma  Now then every 2 hours,   STAT     09/26/18 1723   09/26/18 1722  Blood Culture (routine x 2)  BLOOD CULTURE X 2,   STAT     09/26/18 1723   Signed and Held  Comprehensive metabolic panel  Tomorrow morning,   R     Signed and Held   Signed and Held  CBC WITH DIFFERENTIAL  Tomorrow morning,   R     Signed and Held          Vitals/Pain Today's Vitals   09/26/18 1933 09/26/18 2125 09/26/18 2315 09/26/18 2321  BP: 121/67 131/75 118/66   Pulse: 83 74 78   Resp: (!) 24 (!) 21 20   Temp: 98.6 F (37 C)     TempSrc: Oral     SpO2: 97% 97% 94%   Weight:    59 kg  Height:    5\' 8"  (1.727 m)  PainSc: 5        Isolation Precautions Airborne and Contact precautions  Medications Medications  heparin bolus via infusion 3,000 Units (has no administration in time range)  heparin ADULT infusion 100 units/mL (25000 units/299mL  sodium chloride 0.45%) (has no administration in time range)  acetaminophen (TYLENOL) tablet 1,000 mg (1,000 mg Oral Given 09/26/18 1834)  azithromycin (ZITHROMAX) 500 mg in sodium chloride 0.9 % 250 mL IVPB (500 mg Intravenous New Bag/Given 09/26/18 2036)  cefTRIAXone (ROCEPHIN) 1 g in sodium chloride 0.9 % 100 mL IVPB (1 g Intravenous New Bag/Given 09/26/18 2029)  iohexol (OMNIPAQUE) 350 MG/ML injection 100 mL (100 mLs Intravenous Contrast Given 09/26/18 2140)    Mobility walks Moderate fall risk   Focused Assessments Pulmonary Assessment Handoff:  Lung sounds: Bilateral Breath Sounds: Diminished L Breath Sounds: Clear, Diminished R Breath Sounds: Clear, Diminished O2 Device: Room Air        R Recommendations: See Admitting Provider Note  Report given to:   Additional Notes: NONE

## 2018-09-26 NOTE — Progress Notes (Signed)
ANTICOAGULATION CONSULT NOTE - Initial Consult  Pharmacy Consult for IV heparin Indication: pulmonary embolus  No Known Allergies  Patient Measurements:   Heparin Dosing Weight: 59 kg (acutual)  Vital Signs: Temp: 98.6 F (37 C) (09/04 1933) Temp Source: Oral (09/04 1933) BP: 131/75 (09/04 2125) Pulse Rate: 74 (09/04 2125)  Labs: Recent Labs    09/26/18 1738  HGB 13.3  HCT 39.9  PLT 145*  CREATININE 1.42*  TROPONINIHS 6    CrCl cannot be calculated (Unknown ideal weight.).   Medical History: Past Medical History:  Diagnosis Date  . Cancer ([place])    colon     Medications:  Scheduled:  Infusions:   Assessment: 89 yoM c/o CP and SOB. Covid +.  CT reveals bilateral acute PE with right heart strain.  H/H WNL, Plts = 145, baseline aptt/INR pending  Goal of Therapy:  Heparin level 0.3-0.7 units/ml Monitor platelets by anticoagulation protocol: Yes   Plan:  Baseline aptt , INR, ht, wt STAT Heparin 3000 unit IV bolus x1 Start drip at 1100 units/hr Daily CBC/HL Check 1st HL in 8 hours  Dorrene German 09/26/2018,10:23 PM

## 2018-09-26 NOTE — H&P (Addendum)
History and Physical    Scott Harper [id number] DOB: 1940/08/22 DOA: 09/26/2018  PCP: Patient, No Pcp Per  Patient coming from: Home.  Chief Complaint: Chest pain shortness of breath.  HPI: Scott Harper is a 78 y.o. male with history of colon cancer status post surgery 2 years ago in [place] as per the patient and had followed up with [facility] for chemo presently not on any medication presents to the ER because of 2 days of right-sided chest pain with shortness of breath subjective feeling of fever and some cough.  Denies any dizziness loss of consciousness.  Pain increased on walking along with shortness of breath.  ED Course: In the ER patient was mildly febrile with temperature of 100.4 F tachycardic and chest x-ray showing some infiltrates concerning for pneumonia.  Since patient's d-dimer was elevated CT angiogram was done which shows bilateral pocket pulmonary bolus him with strain pattern.  ER physician discussed with on-call pulmonary critical care who advised at this time since patient is hemodynamically stable to keep patient on heparin and check 2D echo.  EKG shows sinus tachycardia with history changes and ST depression.  Troponins were negative.  Review of Systems: As per HPI, rest all negative.   Past Medical History:  Diagnosis Date   Cancer [facility])    colon     Past Surgical History:  Procedure Laterality Date   COLON SURGERY  2018   TONSILLECTOMY       reports that he has been smoking cigarettes. He has never used smokeless tobacco. He reports current alcohol use. He reports current drug use. Drugs: Oxycodone and Morphine.  No Known Allergies  Family History  Family history unknown: Yes    Prior to Admission medications   Medication Sig Start Date End Date Taking? Authorizing Provider  HYDROcodone-acetaminophen (NORCO/VICODIN) 5-325 MG tablet Take 1 tablet by mouth every 6 (six) hours as needed for moderate pain.   Yes [provider]   mirtazapine (REMERON) 30 MG tablet Take 30 mg by mouth at bedtime.   Yes [provider]  morphine (MS CONTIN) 100 MG 12 hr tablet Take 100 mg by mouth every 12 (twelve) hours.   Yes [provider]  potassium chloride 20 MEQ TBCR Take 20 mEq by mouth 2 (two) times daily. 05/28/17  Yes Davonna Belling, MD    Physical Exam: Constitutional: Moderately built and nourished. Vitals:   09/26/18 1615 09/26/18 1832 09/26/18 1933 09/26/18 2125  BP: 123/65 123/75 121/67 131/75  Pulse: (!) 103 (!) 104 83 74  Resp: (!) 22 (!) 31 (!) 24 (!) 21  Temp: 100.3 F (37.9 C) (!) 100.7 F (38.2 C) 98.6 F (37 C)   TempSrc: Oral Oral Oral   SpO2: 97% 99% 97% 97%   Eyes: Anicteric no pallor. ENMT: No discharge from the ears eyes nose or mouth. Neck: No mass felt.  No neck rigidity. Respiratory: No rhonchi or crepitations. Cardiovascular: S1-S2 heard. Abdomen: Soft nontender bowel sounds present. Musculoskeletal: No edema. Skin: No rash. Neurologic: Alert awake oriented to time place and person.  Moves all extremities. Psychiatric: Appears normal.   Labs on Admission: I have personally reviewed following labs and imaging studies  CBC: Recent Labs  Lab 09/26/18 1738  WBC 11.4*  HGB 13.3  HCT 39.9  MCV 91.3  PLT [id number]*   Basic Metabolic Panel: Recent Labs  Lab 09/26/18 1738  NA 138  K 3.8  CL 106  CO2 21*  GLUCOSE 104*  BUN 17  CREATININE 1.42*  CALCIUM 9.4   GFR: CrCl cannot be calculated (Unknown ideal weight.). Liver Function Tests: Recent Labs  Lab 09/26/18 1738  AST 43*  ALT 33  ALKPHOS 139*  BILITOT 1.9*  PROT 7.0  ALBUMIN 3.7   Recent Labs  Lab 09/26/18 1738  LIPASE 18   No results for input(s): AMMONIA in the last 168 hours. Coagulation Profile: No results for input(s): INR, PROTIME in the last 168 hours. Cardiac Enzymes: No results for input(s): CKTOTAL, CKMB, CKMBINDEX, TROPONINI in the last 168 hours. BNP (last 3 results) No results  for input(s): PROBNP in the last 8760 hours. HbA1C: No results for input(s): HGBA1C in the last 72 hours. CBG: No results for input(s): GLUCAP in the last 168 hours. Lipid Profile: Recent Labs    09/26/18 1738  TRIG 64   Thyroid Function Tests: No results for input(s): TSH, T4TOTAL, FREET4, T3FREE, THYROIDAB in the last 72 hours. Anemia Panel: Recent Labs    09/26/18 1738  FERRITIN 155   Urine analysis:    Component Value Date/Time   COLORURINE YELLOW 09/26/2018 [place] 09/26/2018 1738   LABSPEC 1.018 09/26/2018 1738   PHURINE 5.0 09/26/2018 1738   GLUCOSEU NEGATIVE 09/26/2018 1738   HGBUR NEGATIVE 09/26/2018 1738   BILIRUBINUR NEGATIVE 09/26/2018 1738   KETONESUR 5 (A) 09/26/2018 1738   PROTEINUR 100 (A) 09/26/2018 1738   NITRITE NEGATIVE 09/26/2018 1738   LEUKOCYTESUR NEGATIVE 09/26/2018 1738   Sepsis Labs: @LABRCNTIP (procalcitonin:4,lacticidven:4) ) Recent Results (from the past 240 hour(s))  SARS Coronavirus 2 [facility] order, Performed in [facility] hospital lab) Nasopharyngeal Nasopharyngeal Swab     Status: None   Collection Time: 09/26/18  5:38 PM   Specimen: Nasopharyngeal Swab  Result Value Ref Range Status   SARS Coronavirus 2 NEGATIVE NEGATIVE Final    Comment: (NOTE) If result is NEGATIVE SARS-CoV-2 target nucleic acids are NOT DETECTED. The SARS-CoV-2 RNA is generally detectable in upper and lower  respiratory specimens during the acute phase of infection. The lowest  concentration of SARS-CoV-2 viral copies this assay can detect is 250  copies / mL. A negative result does not preclude SARS-CoV-2 infection  and should not be used as the sole basis for treatment or other  patient management decisions.  A negative result may occur with  improper specimen collection / handling, submission of specimen other  than nasopharyngeal swab, presence of viral mutation(s) within the  areas targeted by this assay, and inadequate number of viral  copies  (<250 copies / mL). A negative result must be combined with clinical  observations, patient history, and epidemiological information. If result is POSITIVE SARS-CoV-2 target nucleic acids are DETECTED. The SARS-CoV-2 RNA is generally detectable in upper and lower  respiratory specimens dur ing the acute phase of infection.  Positive  results are indicative of active infection with SARS-CoV-2.  Clinical  correlation with patient history and other diagnostic information is  necessary to determine patient infection status.  Positive results do  not rule out bacterial infection or co-infection with other viruses. If result is PRESUMPTIVE POSTIVE SARS-CoV-2 nucleic acids MAY BE PRESENT.   A presumptive positive result was obtained on the submitted specimen  and confirmed on repeat testing.  While 2019 novel coronavirus  (SARS-CoV-2) nucleic acids may be present in the submitted sample  additional confirmatory testing may be necessary for epidemiological  and / or clinical management purposes  to differentiate between  SARS-CoV-2 and other Sarbecovirus currently known to infect  humans.  If clinically indicated additional testing with an alternate test  methodology [phone]) is advised. The SARS-CoV-2 RNA is generally  detectable in upper and lower respiratory sp ecimens during the acute  phase of infection. The expected result is Negative. Fact Sheet for Patients:  StrictlyIdeas.no Fact Sheet for Healthcare Providers: BankingDealers.co.za This test is not yet approved or cleared by the [place] FDA and has been authorized for detection and/or diagnosis of SARS-CoV-2 by FDA under an Emergency Use Authorization (EUA).  This EUA will remain in effect (meaning this test can be used) for the duration of the COVID-19 declaration under Section 564(b)(1) of the Act, 21 U.S.C. section 360bbb-3(b)(1), unless the authorization is terminated  or revoked sooner. Performed at [facility], [place] [address], [place], [place] [address]      Radiological Exams on Admission: Dg Chest 2 View  Result Date: 09/26/2018 CLINICAL DATA:  Pt reported chest pains and SOB since last night. Medical hx of colon cancer. Current smoker EXAM: CHEST - 2 VIEW COMPARISON:  None. FINDINGS: The patient has a RIGHT-sided PowerPort, tip overlying the level of the superior vena cava. Heart size is normal. Aorta is mildly tortuous. There are patchy streaky changes at the RIGHT lung base, consistent atelectasis and/or developing infiltrate. LEFT lung is clear. No pulmonary edema. IMPRESSION: Atelectasis and/or developing infiltrate at the RIGHT lung base. Electronically Signed   By: Nolon Nations M.D.   On: 09/26/2018 17:12   Ct Angio Chest Pe W And/or Wo Contrast  Result Date: 09/26/2018 CLINICAL DATA:  Chest pain EXAM: CT ANGIOGRAPHY CHEST WITH CONTRAST TECHNIQUE: Multidetector CT imaging of the chest was performed using the standard protocol during bolus administration of intravenous contrast. Multiplanar CT image reconstructions and MIPs were obtained to evaluate the vascular anatomy. CONTRAST:  163mL OMNIPAQUE IOHEXOL 350 MG/ML SOLN COMPARISON:  None. FINDINGS: Cardiovascular: Contrast injection is sufficient to demonstrate satisfactory opacification of the pulmonary arteries to the segmental level.There is an acute pulmonary embolus involving the left lower lobe pulmonary artery with extension into segmental and subsegmental branches. There are acute appearing subsegmental pulmonary emboli involving the right upper lobe. There are acute pulmonary emboli involving the segmental and subsegmental branches of the right lower lobe. The main pulmonary artery is not dilated. The heart size is mildly enlarged. There is evidence for right-sided heart strain with an RV/LV ratio measuring approximately 1. Coronary artery calcifications are noted. There is a  well-positioned right-sided Port-A-Cath with tip terminating near the cavoatrial junction. Mediastinum/Nodes: --there is scattered calcified hilar and mediastinal lymph nodes. --No axillary lymphadenopathy. --No supraclavicular lymphadenopathy. --Normal thyroid gland. --The esophagus is unremarkable Lungs/Pleura: There are extensive bilateral pleural base nodules there are innumerable small pulmonary nodules throughout both lung fields. There is some architectural distortion of the upper lobes bilaterally. There is consolidation involving the right lower lobe. Upper Abdomen: There are innumerable low-attenuation masses throughout the liver consistent with metastatic disease. Musculoskeletal: No chest wall abnormality. No acute or significant osseous findings. Review of the MIP images confirms the above findings. IMPRESSION: 1. Bilateral acute pulmonary emboli as detailed above with CT evidence for right heart strain. Positive for acute PE with CTevidence of right heart strain (RV/LV Ratio = 1) consistent with at least submassive (intermediate risk) PE. The presence of right heart strain has been associated with an increased risk of morbidity and mortality. 2. Consolidation involving the right lower lobe concerning for pneumonia. A developing pulmonary infarct can have a similar appearance but seems less  likely. 3. Innumerable bilateral pulmonary micro nodules and pleural based nodules as detailed above. Differential considerations include hematogenous metastases or an opportunistic infection. 4. Multiple low-attenuation masses throughout the liver consistent with metastatic disease. These results were called by telephone at the time of interpretation on 09/26/2018 at 9:58 pm to Dr. Lennice Sites , who verbally acknowledged these results. Electronically Signed   By: Constance Holster M.D.   On: 09/26/2018 22:03    EKG: Independently reviewed.  Sinus tachycardia with ST changes with ST  depression.  Assessment/Plan Principal Problem:   Acute pulmonary embolism (HCC) Active Problems:   CAP (community acquired pneumonia)   ARF (acute renal failure) ([place])    1. Acute bilateral pulmonary embolism with strain pattern likely unprovoked hemodynamically stable at this time -patient has been started on heparin.  Pulmonary critical care was notified.  At this time the requested continuing heparin checking 2D echo we will cycle cardiac markers.  We will check Dopplers of the lower extremity. 2. History of colon cancer with present CAT scan showing multiple metastatic lesions.  Will need to get further information from patient's family and oncologist.  At this time not able to access any of his old records. 3. Possible pneumonia versus pulmonary infarct seen in the CAT scan for which patient on empiric antibiotics.  Follow cultures.  Second COVID-19 test is pending.  Given that patient has acute pulmonary embolism with strain pattern with concerns for likelihood of deterioration patient will need close follow-up and inpatient status.  I try to reach patient's daughter with the number provided but was unable to reach.   DVT prophylaxis: Heparin. Code Status: DNR confirmed with patient.  Patient states he was referred to hospice. Family Communication: We will need to discuss with patient's daughter.  I try to reach patient's daughter with the number provided unable to reach. Disposition Plan: To be determined. Consults called: Pulmonary critical care. Admission status: Inpatient.   Rise Patience MD [facility] Pager [phone].  If 7PM-7AM, please contact night-coverage [email or web address] Password [email or web address]  09/26/2018, 10:56 PM

## 2018-09-26 NOTE — ED Provider Notes (Signed)
[facility] DEPT Provider Note   CSN: [id number] Arrival date & time: 09/26/18  1559     History   Chief Complaint Chief Complaint  Patient presents with  . Shortness of Breath  . Chest Pain    HPI Scott Harper is a 78 y.o. male.     The history is provided by the patient.  Shortness of Breath Severity:  Mild Onset quality:  Gradual Duration:  2 days Timing:  Constant Progression:  Unchanged Chronicity:  New Context: URI   Relieved by:  Nothing Worsened by:  Nothing Associated symptoms: chest pain, cough and fever   Associated symptoms: no abdominal pain, no ear pain, no rash, no sore throat and no vomiting   Risk factors: hx of cancer   Risk factors: no hx of PE/DVT     Past Medical History:  Diagnosis Date  . Cancer [facility])    colon     Patient Active Problem List   Diagnosis Date Noted  . CAP (community acquired pneumonia) 09/26/2018  . Acute pulmonary embolism ([place]) 09/26/2018  . ARF (acute renal failure) ([place]) 09/26/2018    Past Surgical History:  Procedure Laterality Date  . COLON SURGERY  2018  . TONSILLECTOMY          Home Medications    Prior to Admission medications   Medication Sig Start Date End Date Taking? Authorizing Provider  HYDROcodone-acetaminophen (NORCO/VICODIN) 5-325 MG tablet Take 1 tablet by mouth every 6 (six) hours as needed for moderate pain.   Yes [provider]  mirtazapine (REMERON) 30 MG tablet Take 30 mg by mouth at bedtime.   Yes [provider]  morphine (MS CONTIN) 100 MG 12 hr tablet Take 100 mg by mouth every 12 (twelve) hours.   Yes [provider]  potassium chloride 20 MEQ TBCR Take 20 mEq by mouth 2 (two) times daily. 05/28/17  Yes Davonna Belling, MD    Family History Family History  Family history unknown: Yes    Social History Social History   Tobacco Use  . Smoking status: Current Every Day Smoker    Types: Cigarettes  . Smokeless  tobacco: Never Used  Substance Use Topics  . Alcohol use: Yes    Comment: ONCE IN A WHILE  . Drug use: Yes    Types: Oxycodone, Morphine    Comment: PRESCRIBED MEDS     Allergies   Patient has no known allergies.   Review of Systems Review of Systems  Constitutional: Positive for fever. Negative for chills.  HENT: Negative for ear pain and sore throat.   Eyes: Negative for pain and visual disturbance.  Respiratory: Positive for cough and shortness of breath.   Cardiovascular: Positive for chest pain. Negative for palpitations.  Gastrointestinal: Negative for abdominal pain and vomiting.  Genitourinary: Negative for dysuria and hematuria.  Musculoskeletal: Negative for arthralgias and back pain.  Skin: Negative for color change and rash.  Neurological: Negative for seizures and syncope.  All other systems reviewed and are negative.    Physical Exam Updated Vital Signs BP 118/66 (BP Location: Right Arm)   Pulse 78   Temp 98.6 F (37 C) (Oral)   Resp 20   Ht 5\' 8"  (1.727 m)   Wt 59 kg   SpO2 94%   BMI 19.77 kg/m   Physical Exam Vitals signs and nursing note reviewed.  Constitutional:      General: He is not in acute distress.    Appearance: He is  well-developed. He is not ill-appearing.  HENT:     Head: Normocephalic and atraumatic.     Mouth/Throat:     Mouth: Mucous membranes are moist.     Pharynx: Oropharynx is clear.  Eyes:     Conjunctiva/sclera: Conjunctivae normal.     Pupils: Pupils are equal, round, and reactive to light.  Neck:     Musculoskeletal: Normal range of motion and neck supple.  Cardiovascular:     Rate and Rhythm: Normal rate and regular rhythm.     Heart sounds: No murmur.  Pulmonary:     Effort: Tachypnea present. No respiratory distress.     Breath sounds: Decreased breath sounds (right side decreased, coarse) present. No wheezing, rhonchi or rales.  Abdominal:     Palpations: Abdomen is soft.     Tenderness: There is no  abdominal tenderness.  Musculoskeletal:     Right lower leg: No edema.     Left lower leg: No edema.  Skin:    General: Skin is warm and dry.     Capillary Refill: Capillary refill takes less than 2 seconds.  Neurological:     General: No focal deficit present.     Mental Status: He is alert.  Psychiatric:        Mood and Affect: Mood normal.      ED Treatments / Results  Labs (all labs ordered are listed, but only abnormal results are displayed) Labs Reviewed  BASIC METABOLIC PANEL - Abnormal; Notable for the following components:      Result Value   CO2 21 (*)    Glucose, Bld 104 (*)    Creatinine, Ser 1.42 (*)    GFR calc non Af Amer 47 (*)    GFR calc Af Amer 55 (*)    All other components within normal limits  CBC - Abnormal; Notable for the following components:   WBC 11.4 (*)    Platelets 145 (*)    All other components within normal limits  D-DIMER, QUANTITATIVE (NOT AT [facility]) - Abnormal; Notable for the following components:   D-Dimer, Quant 6.90 (*)    All other components within normal limits  LACTATE DEHYDROGENASE - Abnormal; Notable for the following components:   LDH 303 (*)    All other components within normal limits  FIBRINOGEN - Abnormal; Notable for the following components:   Fibrinogen 545 (*)    All other components within normal limits  C-REACTIVE PROTEIN - Abnormal; Notable for the following components:   CRP 18.6 (*)    All other components within normal limits  URINALYSIS, ROUTINE W REFLEX MICROSCOPIC - Abnormal; Notable for the following components:   Ketones, ur 5 (*)    Protein, ur 100 (*)    All other components within normal limits  HEPATIC FUNCTION PANEL - Abnormal; Notable for the following components:   AST 43 (*)    Alkaline Phosphatase 139 (*)    Total Bilirubin 1.9 (*)    Bilirubin, Direct 0.6 (*)    Indirect Bilirubin 1.3 (*)    All other components within normal limits  SARS CORONAVIRUS 2 (HOSPITAL ORDER, [place] [facility])  CULTURE, BLOOD (ROUTINE X 2)  CULTURE, BLOOD (ROUTINE X 2)  URINE CULTURE  SARS CORONAVIRUS 2 (HOSPITAL ORDER, [place] [facility])  LACTIC ACID, PLASMA  PROCALCITONIN  FERRITIN  TRIGLYCERIDES  LIPASE, BLOOD  LACTIC ACID, PLASMA  APTT  PROTIME-INR  TROPONIN I (HIGH SENSITIVITY)  TROPONIN I (HIGH  SENSITIVITY)    EKG EKG Interpretation  Date/Time:  Friday September 26 2018 16:12:47 EDT Ventricular Rate:  102 PR Interval:    QRS Duration: 77 QT Interval:  344 QTC Calculation: 449 R Axis:   -33 Text Interpretation:  Sinus tachycardia LAE, consider biatrial enlargement Left axis deviation Anteroseptal infarct, age indeterminate Confirmed by Lennice Sites [phone]) on 09/26/2018 6:49:49 PM   Radiology Dg Chest 2 View  Result Date: 09/26/2018 CLINICAL DATA:  Pt reported chest pains and SOB since last night. Medical hx of colon cancer. Current smoker EXAM: CHEST - 2 VIEW COMPARISON:  None. FINDINGS: The patient has a RIGHT-sided PowerPort, tip overlying the level of the superior vena cava. Heart size is normal. Aorta is mildly tortuous. There are patchy streaky changes at the RIGHT lung base, consistent atelectasis and/or developing infiltrate. LEFT lung is clear. No pulmonary edema. IMPRESSION: Atelectasis and/or developing infiltrate at the RIGHT lung base. Electronically Signed   By: Nolon Nations M.D.   On: 09/26/2018 17:12   Ct Angio Chest Pe W And/or Wo Contrast  Result Date: 09/26/2018 CLINICAL DATA:  Chest pain EXAM: CT ANGIOGRAPHY CHEST WITH CONTRAST TECHNIQUE: Multidetector CT imaging of the chest was performed using the standard protocol during bolus administration of intravenous contrast. Multiplanar CT image reconstructions and MIPs were obtained to evaluate the vascular anatomy. CONTRAST:  179mL OMNIPAQUE IOHEXOL 350 MG/ML SOLN COMPARISON:  None. FINDINGS: Cardiovascular: Contrast injection is sufficient to demonstrate satisfactory  opacification of the pulmonary arteries to the segmental level.There is an acute pulmonary embolus involving the left lower lobe pulmonary artery with extension into segmental and subsegmental branches. There are acute appearing subsegmental pulmonary emboli involving the right upper lobe. There are acute pulmonary emboli involving the segmental and subsegmental branches of the right lower lobe. The main pulmonary artery is not dilated. The heart size is mildly enlarged. There is evidence for right-sided heart strain with an RV/LV ratio measuring approximately 1. Coronary artery calcifications are noted. There is a well-positioned right-sided Port-A-Cath with tip terminating near the cavoatrial junction. Mediastinum/Nodes: --there is scattered calcified hilar and mediastinal lymph nodes. --No axillary lymphadenopathy. --No supraclavicular lymphadenopathy. --Normal thyroid gland. --The esophagus is unremarkable Lungs/Pleura: There are extensive bilateral pleural base nodules there are innumerable small pulmonary nodules throughout both lung fields. There is some architectural distortion of the upper lobes bilaterally. There is consolidation involving the right lower lobe. Upper Abdomen: There are innumerable low-attenuation masses throughout the liver consistent with metastatic disease. Musculoskeletal: No chest wall abnormality. No acute or significant osseous findings. Review of the MIP images confirms the above findings. IMPRESSION: 1. Bilateral acute pulmonary emboli as detailed above with CT evidence for right heart strain. Positive for acute PE with CTevidence of right heart strain (RV/LV Ratio = 1) consistent with at least submassive (intermediate risk) PE. The presence of right heart strain has been associated with an increased risk of morbidity and mortality. 2. Consolidation involving the right lower lobe concerning for pneumonia. A developing pulmonary infarct can have a similar appearance but seems less  likely. 3. Innumerable bilateral pulmonary micro nodules and pleural based nodules as detailed above. Differential considerations include hematogenous metastases or an opportunistic infection. 4. Multiple low-attenuation masses throughout the liver consistent with metastatic disease. These results were called by telephone at the time of interpretation on 09/26/2018 at 9:58 pm to Dr. Lennice Sites , who verbally acknowledged these results. Electronically Signed   By: Constance Holster M.D.   On: 09/26/2018 22:03  Procedures .Critical Care Performed by: Lennice Sites, DO Authorized by: Lennice Sites, DO   Critical care provider statement:    Critical care time (minutes):  65   Critical care was necessary to treat or prevent imminent or life-threatening deterioration of the following conditions:  Respiratory failure and sepsis   Critical care was time spent personally by me on the following activities:  Blood draw for specimens, development of treatment plan with patient or surrogate, discussions with consultants, discussions with primary provider, evaluation of patient's response to treatment, examination of patient, obtaining history from patient or surrogate, ordering and performing treatments and interventions, ordering and review of radiographic studies, ordering and review of laboratory studies, pulse oximetry, re-evaluation of patient's condition and review of old charts   I assumed direction of critical care for this patient from another provider in my specialty: no     (including critical care time)  Medications Ordered in ED Medications  heparin bolus via infusion 3,000 Units (has no administration in time range)  heparin ADULT infusion 100 units/mL (25000 units/250mL sodium chloride 0.45%) (has no administration in time range)  acetaminophen (TYLENOL) tablet 1,000 mg (1,000 mg Oral Given 09/26/18 1834)  azithromycin (ZITHROMAX) 500 mg in sodium chloride 0.9 % 250 mL IVPB (0 mg  Intravenous Stopped 09/26/18 2136)  cefTRIAXone (ROCEPHIN) 1 g in sodium chloride 0.9 % 100 mL IVPB (0 g Intravenous Stopped 09/26/18 2059)  iohexol (OMNIPAQUE) 350 MG/ML injection 100 mL (100 mLs Intravenous Contrast Given 09/26/18 2140)     Initial Impression / Assessment and Plan / ED Course  I have reviewed the triage vital signs and the nursing notes.  Pertinent labs & imaging results that were available during my care of the patient were reviewed by me and considered in my medical decision making (see chart for details).     Scott Harper is a 78 year old male with history of colon cancer not on any treatment who presents to the ED with shortness of breath, chest pain.  Patient with low-grade fever but otherwise unremarkable vitals.  Mildly tachycardic.  Possible sepsis.  Has had cough, fever since yesterday.  No known contacts with coronavirus.  Not undergoing any cancer treatment.  Overall patient appears well.  Has some coarse breath sounds on the right side.  No signs of volume overload.  Denies any urinary symptoms.  Denies any abdominal pain.  Has right-sided chest pain.  Given low-grade fever, fever at home, tachypnea, tachycardia sepsis work-up was initiated.  Coronavirus test is negative.  Patient mild leukocytosis.  Normal lactic acidosis.  D-dimer elevated.  CT scan of the chest showed bilateral PE with right heart strain.  However patient hemodynamically stable.  Talked with intensivist on the phone and no need for thrombolytics.  Will start the patient on heparin.  CT scan also confirms pneumonia and patient was given IV Zithromax and IV Rocephin.  Concern for possible metastasis in the lungs and liver as well.  Patient admitted to medicine for further care.  Hemodynamically stable throughout my care.  This chart was dictated using voice recognition software.  Despite best efforts to proofread,  errors can occur which can change the documentation meaning.    Final Clinical  Impressions(s) / ED Diagnoses   Final diagnoses:  Community acquired pneumonia, unspecified laterality  Acute pulmonary embolism without acute cor pulmonale, unspecified pulmonary embolism type [facility])    ED Discharge Orders    None       Lennice Sites, DO 09/27/18 0005

## 2018-09-26 NOTE — ED Triage Notes (Signed)
Pt reports chest pains and SOB since last night.

## 2018-09-26 NOTE — ED Notes (Addendum)
CT dept asked to treat pt as positive COVID for now d/t symptoms until I get orders from admitting MD. So as not to delay scan

## 2018-09-27 ENCOUNTER — Inpatient Hospital Stay (HOSPITAL_COMMUNITY): Payer: Medicare Other

## 2018-09-27 ENCOUNTER — Encounter (HOSPITAL_COMMUNITY): Payer: Self-pay | Admitting: *Deleted

## 2018-09-27 ENCOUNTER — Other Ambulatory Visit (HOSPITAL_COMMUNITY)

## 2018-09-27 ENCOUNTER — Other Ambulatory Visit: Payer: Self-pay

## 2018-09-27 DIAGNOSIS — N183 Chronic kidney disease, stage 3 unspecified: Secondary | ICD-10-CM

## 2018-09-27 DIAGNOSIS — I2699 Other pulmonary embolism without acute cor pulmonale: Principal | ICD-10-CM

## 2018-09-27 DIAGNOSIS — Z515 Encounter for palliative care: Secondary | ICD-10-CM

## 2018-09-27 DIAGNOSIS — I361 Nonrheumatic tricuspid (valve) insufficiency: Secondary | ICD-10-CM

## 2018-09-27 DIAGNOSIS — C799 Secondary malignant neoplasm of unspecified site: Secondary | ICD-10-CM

## 2018-09-27 DIAGNOSIS — J189 Pneumonia, unspecified organism: Secondary | ICD-10-CM

## 2018-09-27 DIAGNOSIS — E876 Hypokalemia: Secondary | ICD-10-CM

## 2018-09-27 LAB — COMPREHENSIVE METABOLIC PANEL
ALT: 30 U/L (ref 0–44)
AST: 37 U/L (ref 15–41)
Albumin: 3.5 g/dL (ref 3.5–5.0)
Alkaline Phosphatase: 127 U/L — ABNORMAL HIGH (ref 38–126)
Anion gap: 12 (ref 5–15)
BUN: 17 mg/dL (ref 8–23)
CO2: 18 mmol/L — ABNORMAL LOW (ref 22–32)
Calcium: 8.8 mg/dL — ABNORMAL LOW (ref 8.9–10.3)
Chloride: 107 mmol/L (ref 98–111)
Creatinine, Ser: 1.32 mg/dL — ABNORMAL HIGH (ref 0.61–1.24)
GFR calc Af Amer: 60 mL/min — ABNORMAL LOW (ref >60)
GFR calc non Af Amer: 52 mL/min — ABNORMAL LOW (ref >60)
Glucose, Bld: 106 mg/dL — ABNORMAL HIGH (ref 70–99)
Potassium: 3.6 mmol/L (ref 3.5–5.1)
Sodium: 137 mmol/L (ref 135–145)
Total Bilirubin: 1.4 mg/dL — ABNORMAL HIGH (ref 0.3–1.2)
Total Protein: 6.6 g/dL (ref 6.5–8.1)

## 2018-09-27 LAB — CBC WITH DIFFERENTIAL/PLATELET
Abs Immature Granulocytes: 0.06 10*3/uL (ref 0.00–0.07)
Basophils Absolute: 0 10*3/uL (ref 0.0–0.1)
Basophils Relative: 0 %
Eosinophils Absolute: 0 10*3/uL (ref 0.0–0.5)
Eosinophils Relative: 0 %
HCT: 38 % — ABNORMAL LOW (ref 39.0–52.0)
Hemoglobin: 12.5 g/dL — ABNORMAL LOW (ref 13.0–17.0)
Immature Granulocytes: 1 %
Lymphocytes Relative: 15 %
Lymphs Abs: 1.7 10*3/uL (ref 0.7–4.0)
MCH: 30.1 pg (ref 26.0–34.0)
MCHC: 32.9 g/dL (ref 30.0–36.0)
MCV: 91.6 fL (ref 80.0–100.0)
Monocytes Absolute: 1.2 10*3/uL — ABNORMAL HIGH (ref 0.1–1.0)
Monocytes Relative: 11 %
Neutro Abs: 8.2 10*3/uL — ABNORMAL HIGH (ref 1.7–7.7)
Neutrophils Relative %: 73 %
Platelets: 123 10*3/uL — ABNORMAL LOW (ref 150–400)
RBC: 4.15 MIL/uL — ABNORMAL LOW (ref 4.22–5.81)
RDW: 15 % (ref 11.5–15.5)
WBC: 11.2 10*3/uL — ABNORMAL HIGH (ref 4.0–10.5)
nRBC: 0 % (ref 0.0–0.2)

## 2018-09-27 LAB — PROTIME-INR
INR: 1.3 — ABNORMAL HIGH (ref 0.8–1.2)
Prothrombin Time: 15.6 seconds — ABNORMAL HIGH (ref 11.4–15.2)

## 2018-09-27 LAB — CBC
HCT: 35.8 % — ABNORMAL LOW (ref 39.0–52.0)
Hemoglobin: 11.5 g/dL — ABNORMAL LOW (ref 13.0–17.0)
MCH: 29.3 pg (ref 26.0–34.0)
MCHC: 32.1 g/dL (ref 30.0–36.0)
MCV: 91.1 fL (ref 80.0–100.0)
Platelets: 138 10*3/uL — ABNORMAL LOW (ref 150–400)
RBC: 3.93 MIL/uL — ABNORMAL LOW (ref 4.22–5.81)
RDW: 15 % (ref 11.5–15.5)
WBC: 10 10*3/uL (ref 4.0–10.5)
nRBC: 0 % (ref 0.0–0.2)

## 2018-09-27 LAB — TROPONIN I (HIGH SENSITIVITY): Troponin I (High Sensitivity): 7 ng/L (ref <18)

## 2018-09-27 LAB — SARS CORONAVIRUS 2 BY RT PCR (HOSPITAL ORDER, PERFORMED IN ~~LOC~~ HOSPITAL LAB): SARS Coronavirus 2: NEGATIVE

## 2018-09-27 LAB — APTT: aPTT: 182 seconds (ref 24–36)

## 2018-09-27 LAB — HEPARIN LEVEL (UNFRACTIONATED)
Heparin Unfractionated: 0.69 IU/mL (ref 0.30–0.70)
Heparin Unfractionated: 0.75 IU/mL — ABNORMAL HIGH (ref 0.30–0.70)

## 2018-09-27 LAB — ECHOCARDIOGRAM COMPLETE
Height: 68 in
Weight: 1929.6 oz

## 2018-09-27 MED ORDER — MORPHINE SULFATE ER 100 MG PO TBCR
100.0000 mg | EXTENDED_RELEASE_TABLET | Freq: Two times a day (BID) | ORAL | Status: DC
  Administered 2018-09-27 – 2018-09-28 (×3): 100 mg via ORAL
  Filled 2018-09-27 (×3): qty 1

## 2018-09-27 MED ORDER — ACETAMINOPHEN 650 MG RE SUPP: 650.0000 mg | Freq: Four times a day (QID) | RECTAL | Status: DC | PRN

## 2018-09-27 MED ORDER — SODIUM CHLORIDE 0.9 % IV SOLN
2.0000 g | INTRAVENOUS | Status: DC
  Administered 2018-09-27: 2 g via INTRAVENOUS
  Filled 2018-09-27: qty 2

## 2018-09-27 MED ORDER — ONDANSETRON HCL 4 MG/2ML IJ SOLN
4.0000 mg | Freq: Four times a day (QID) | INTRAMUSCULAR | Status: DC | PRN
  Administered 2018-09-27: 4 mg via INTRAVENOUS
  Filled 2018-09-27: qty 2

## 2018-09-27 MED ORDER — ENSURE ENLIVE PO LIQD
237.0000 mL | Freq: Two times a day (BID) | ORAL | Status: DC
  Administered 2018-09-27 – 2018-09-28 (×2): 237 mL via ORAL

## 2018-09-27 MED ORDER — HYDROCODONE-ACETAMINOPHEN 5-325 MG PO TABS
1.0000 | ORAL_TABLET | Freq: Four times a day (QID) | ORAL | Status: DC | PRN
  Administered 2018-09-27: 2 via ORAL
  Filled 2018-09-27: qty 2

## 2018-09-27 MED ORDER — MIRTAZAPINE 15 MG PO TABS
30.0000 mg | ORAL_TABLET | Freq: Every day | ORAL | Status: DC
  Administered 2018-09-27: 30 mg via ORAL
  Filled 2018-09-27: qty 2

## 2018-09-27 MED ORDER — ACETAMINOPHEN 325 MG PO TABS: 650.0000 mg | ORAL_TABLET | Freq: Four times a day (QID) | ORAL | Status: DC | PRN

## 2018-09-27 MED ORDER — SODIUM CHLORIDE 0.9% FLUSH: 10.0000 mL | INTRAVENOUS | Status: DC | PRN

## 2018-09-27 MED ORDER — SODIUM CHLORIDE 0.9 % IV SOLN
500.0000 mg | INTRAVENOUS | Status: DC
  Administered 2018-09-27: 500 mg via INTRAVENOUS
  Filled 2018-09-27: qty 500

## 2018-09-27 MED ORDER — POTASSIUM CHLORIDE CRYS ER 20 MEQ PO TBCR
20.0000 meq | EXTENDED_RELEASE_TABLET | Freq: Two times a day (BID) | ORAL | Status: DC
  Administered 2018-09-27 – 2018-09-28 (×3): 20 meq via ORAL
  Filled 2018-09-27 (×3): qty 1

## 2018-09-27 MED ORDER — DIPHENHYDRAMINE HCL 50 MG/ML IJ SOLN
12.5000 mg | Freq: Three times a day (TID) | INTRAMUSCULAR | Status: DC | PRN
  Administered 2018-09-27: 12.5 mg via INTRAVENOUS
  Filled 2018-09-27: qty 1

## 2018-09-27 MED ORDER — HEPARIN (PORCINE) 25000 UT/250ML-% IV SOLN: 1000.0000 [IU]/h | INTRAVENOUS | Status: DC

## 2018-09-27 MED ORDER — ONDANSETRON HCL 4 MG PO TABS: 4.0000 mg | ORAL_TABLET | Freq: Four times a day (QID) | ORAL | Status: DC | PRN

## 2018-09-27 NOTE — Progress Notes (Signed)
CRITICAL VALUE ALERT  Critical Value:  PTT  Date & Time Notied:  09/27/18@0230   Provider Notified: Bodenheimer  Orders Received/Actions taken:

## 2018-09-27 NOTE — Progress Notes (Signed)
Notified Bodenheimer of pt c/o of pain and requesting home meds.

## 2018-09-27 NOTE — Progress Notes (Signed)
  Echocardiogram 2D Echocardiogram has been performed.  Scott Harper 09/27/2018, 9:47 AM

## 2018-09-27 NOTE — Progress Notes (Signed)
Critical aptt  Baseline aptt was ordered to be drawn before heparin was started, but was instead drawn after drip started. Aptt = 184 reflects heparin bolus and drip. Not an accurate level.  Plan: Will check HL 8 hours after heparin was started to monitor.  Thanks Dorrene German 09/27/2018 2:49 AM

## 2018-09-27 NOTE — Plan of Care (Signed)
  Problem: Pain Managment: Goal: General experience of comfort will improve Outcome: Progressing   Problem: Safety: Goal: Ability to remain free from injury will improve Outcome: Progressing   

## 2018-09-27 NOTE — Progress Notes (Signed)
PROGRESS NOTE  Benney Yoneda [id number] DOB: 09/20/1940 DOA: 09/26/2018 PCP: Patient, No Pcp Per  HPI/Recap of past 24 hours:  He states feeling better, he is on room air at rest,   Assessment/Plan: Principal Problem:   Acute pulmonary embolism ([place]) Active Problems:   CAP (community acquired pneumonia)   ARF (acute renal failure) ([place])  Acute bilateral pulmonary embolism with strain pattern in the setting of metastatic cancer  - hemodynamically stable,  he is started on heparin drip, will continue for now, -negative troponin, echo lvef wnl,  The right ventricle has mildly reduced systolic function. The cavity was mildly enlarged - bilateral lower extremity venous doppler no DVT  - pulmonary/critical care input appreciated, no lytics.  -likely transition to eliquis tomorrow   Possible pneumonia versus pulmonary infarct seen in the CAT scan for which patient on empiric antibiotics.  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 Patient is not able to provide detail.  I have contacted hospice liaison here who states patient was referred to community hospice in [place] [phone]) in 06/2018,  Verified with  community hospice in [place] who confirmed that patient is active with hospice. They are not aware of hospitalization though   hospice agency states it is ok to discharge patient home on anticoagulation, they will continue to follow patient.  Chronic pain:  Continue home regimen, currently he denies pain   DVT Prophylaxis:on heparin drip  Code Status: DNR  Family  Communication: patient , not able to reach daughter at [phone], per hospice agency patient's daughter's phone number is [phone], I called this number, no one pick up the phone, will try tomorrow   Disposition Plan:  Possible home 1-2 days and continue home hospice  Consultants:  Pulm/critical care  Procedures:  none  Antibiotics:  As above   Objective: BP 110/66 (BP Location: Right Arm)   Pulse 62   Temp 97.9 F (36.6 C) (Oral)   Resp 15   Ht 5\' 8"  (1.727 m)   Wt 54.7 kg   SpO2 97%   BMI 18.34 kg/m   Intake/Output Summary (Last 24 hours) at 09/27/2018 1617 Last data filed at 09/27/2018 1337 Gross per 24 hour  Intake 757.35 ml  Output 100 ml  Net 657.35 ml   Filed Weights   09/26/18 2321 09/27/18 0438  Weight: 59 kg 54.7 kg    Exam: Patient is examined daily including today on 09/27/2018, exams remain the same as of yesterday except that has changed    General:  NAD  Cardiovascular: RRR  Respiratory: CTABL  Abdomen: Soft/ND/NT, positive BS  Musculoskeletal: No Edema  Neuro: alert, oriented   Data Reviewed: Basic Metabolic Panel: Recent Labs  Lab 09/26/18 1738 09/27/18 0135  NA 138 137  K 3.8 3.6  CL 106 107  CO2 21* 18*  GLUCOSE 104* 106*  BUN 17 17  CREATININE 1.42* 1.32*  CALCIUM 9.4 8.8*   Liver Function Tests: Recent Labs  Lab 09/26/18 1738 09/27/18 0135  AST 43* 37  ALT 33 30  ALKPHOS 139* 127*  BILITOT 1.9* 1.4*  PROT 7.0  6.6  ALBUMIN 3.7 3.5   Recent Labs  Lab 09/26/18 1738  LIPASE 18   No results for input(s): AMMONIA in the last 168 hours. CBC: Recent Labs  Lab 09/26/18 1738 09/27/18 0135 09/27/18 0814  WBC 11.4* 11.2* 10.0  NEUTROABS  --  8.2*  --   HGB 13.3 12.5* 11.5*  HCT 39.9 38.0* 35.8*  MCV 91.3 91.6 91.1  PLT 145* 123* 138*   Cardiac Enzymes:   No results for input(s): CKTOTAL, CKMB, CKMBINDEX, TROPONINI in the last 168 hours. BNP (last 3 results) No results for input(s): BNP in the last  8760 hours.  ProBNP (last 3 results) No results for input(s): PROBNP in the last 8760 hours.  CBG: No results for input(s): GLUCAP in the last 168 hours.  Recent Results (from the past 240 hour(s))  Blood Culture (routine x 2)     Status: None (Preliminary result)   Collection Time: 09/26/18  5:28 PM   Specimen: BLOOD  Result Value Ref Range Status   Specimen Description   Final    BLOOD LEFT WRIST Performed at [facility] [address] [place], [place] [address]    Special Requests   Final    BOTTLES DRAWN AEROBIC AND ANAEROBIC Blood Culture adequate volume Performed at [facility] [address], [place], [place] [address]    Culture   Final    NO GROWTH < 24 HOURS Performed at [facility] [address] [place], [place] [address]    Report Status PENDING  Incomplete  Blood Culture (routine x 2)     Status: None (Preliminary result)   Collection Time: 09/26/18  5:28 PM   Specimen: BLOOD  Result Value Ref Range Status   Specimen Description   Final    BLOOD LEFT ANTECUBITAL Performed at [facility] [address], [place], [place] [address]    Special Requests   Final    BOTTLES DRAWN AEROBIC ONLY Blood Culture results may not be optimal due to an inadequate volume of blood received in culture bottles Performed at [facility] [address], [place], [place] [address]    Culture  Setup Time   Final    GRAM POSITIVE RODS AEROBIC BOTTLE ONLY CRITICAL RESULT CALLED TO, READ BACK BY AND VERIFIED WITH: Otila Back [id number] Performed at [facility], [place] [address], [place],  [address]    Culture GRAM POSITIVE RODS  Final   Report Status PENDING  Incomplete  SARS Coronavirus 2 [facility] order, Performed in [facility] lab) Nasopharyngeal Nasopharyngeal Swab     Status: None   Collection Time: 09/26/18  5:38 PM   Specimen: Nasopharyngeal Swab   Result Value Ref Range Status   SARS Coronavirus 2 NEGATIVE NEGATIVE Final    Comment: (NOTE) If result is NEGATIVE SARS-CoV-2 target nucleic acids are NOT DETECTED. The SARS-CoV-2 RNA is generally detectable in upper and lower  respiratory specimens during the acute phase of infection. The lowest  concentration of SARS-CoV-2 viral copies this assay can detect is 250  copies / mL. A negative result does not preclude SARS-CoV-2 infection  and should not be used as the sole basis for treatment or other  patient management decisions.  A negative result may occur with  improper specimen collection / handling, submission of specimen other  than nasopharyngeal swab, presence of viral mutation(s) within the  areas targeted by this assay, and inadequate  number of viral copies  (<250 copies / mL). A negative result must be combined with clinical  observations, patient history, and epidemiological information. If result is POSITIVE SARS-CoV-2 target nucleic acids are DETECTED. The SARS-CoV-2 RNA is generally detectable in upper and lower  respiratory specimens dur ing the acute phase of infection.  Positive  results are indicative of active infection with SARS-CoV-2.  Clinical  correlation with patient history and other diagnostic information is  necessary to determine patient infection status.  Positive results do  not rule out bacterial infection or co-infection with other viruses. If result is PRESUMPTIVE POSTIVE SARS-CoV-2 nucleic acids MAY BE PRESENT.   A presumptive positive result was obtained on the submitted specimen  and confirmed on repeat testing.  While 2019 novel coronavirus  (SARS-CoV-2) nucleic acids may be present in the submitted sample  additional confirmatory testing may be necessary for epidemiological  and / or clinical management purposes  to differentiate between  SARS-CoV-2 and other Sarbecovirus currently known to infect humans.  If clinically indicated additional  testing with an alternate test  methodology [id number]) is advised. The SARS-CoV-2 RNA is generally  detectable in upper and lower respiratory sp ecimens during the acute  phase of infection. The expected result is Negative. Fact Sheet for Patients:  StrictlyIdeas.no Fact Sheet for Healthcare Providers: BankingDealers.co.za This test is not yet approved or cleared by the [place] FDA and has been authorized for detection and/or diagnosis of SARS-CoV-2 by FDA under an Emergency Use Authorization (EUA).  This EUA will remain in effect (meaning this test can be used) for the duration of the COVID-19 declaration under Section 564(b)(1) of the Act, 21 U.S.C. section 360bbb-3(b)(1), unless the authorization is terminated or revoked sooner. Performed at [facility], [place] [address] [place], [place] [address]   SARS Coronavirus 2 [facility] order, Performed in [facility] hospital lab) Nasopharyngeal Nasopharyngeal Swab     Status: None   Collection Time: 09/26/18 10:14 PM   Specimen: Nasopharyngeal Swab  Result Value Ref Range Status   SARS Coronavirus 2 NEGATIVE NEGATIVE Final    Comment: (NOTE) If result is NEGATIVE SARS-CoV-2 target nucleic acids are NOT DETECTED. The SARS-CoV-2 RNA is generally detectable in upper and lower  respiratory specimens during the acute phase of infection. The lowest  concentration of SARS-CoV-2 viral copies this assay can detect is 250  copies / mL. A negative result does not preclude SARS-CoV-2 infection  and should not be used as the sole basis for treatment or other  patient management decisions.  A negative result may occur with  improper specimen collection / handling, submission of specimen other  than nasopharyngeal swab, presence of viral mutation(s) within the  areas targeted by this assay, and inadequate number of viral copies  (<250 copies / mL). A negative result must be  combined with clinical  observations, patient history, and epidemiological information. If result is POSITIVE SARS-CoV-2 target nucleic acids are DETECTED. The SARS-CoV-2 RNA is generally detectable in upper and lower  respiratory specimens dur ing the acute phase of infection.  Positive  results are indicative of active infection with SARS-CoV-2.  Clinical  correlation with patient history and other diagnostic information is  necessary to determine patient infection status.  Positive results do  not rule out bacterial infection or co-infection with other viruses. If result is PRESUMPTIVE POSTIVE SARS-CoV-2 nucleic acids MAY BE PRESENT.   A presumptive positive result was obtained on the submitted specimen  and confirmed on repeat  testing.  While 2019 novel coronavirus  (SARS-CoV-2) nucleic acids may be present in the submitted sample  additional confirmatory testing may be necessary for epidemiological  and / or clinical management purposes  to differentiate between  SARS-CoV-2 and other Sarbecovirus currently known to infect humans.  If clinically indicated additional testing with an alternate test  methodology [phone]) is advised. The SARS-CoV-2 RNA is generally  detectable in upper and lower respiratory sp ecimens during the acute  phase of infection. The expected result is Negative. Fact Sheet for Patients:  StrictlyIdeas.no Fact Sheet for Healthcare Providers: BankingDealers.co.za This test is not yet approved or cleared by the [place] FDA and has been authorized for detection and/or diagnosis of SARS-CoV-2 by FDA under an Emergency Use Authorization (EUA).  This EUA will remain in effect (meaning this test can be used) for the duration of the COVID-19 declaration under Section 564(b)(1) of the Act, 21 U.S.C. section 360bbb-3(b)(1), unless the authorization is terminated or revoked sooner. Performed at [facility], [place] [address], [place], [place] [address]      Studies: Dg Chest 2 View  Result Date: 09/26/2018 CLINICAL DATA:  Pt reported chest pains and SOB since last night. Medical hx of colon cancer. Current smoker EXAM: CHEST - 2 VIEW COMPARISON:  None. FINDINGS: The patient has a RIGHT-sided PowerPort, tip overlying the level of the superior vena cava. Heart size is normal. Aorta is mildly tortuous. There are patchy streaky changes at the RIGHT lung base, consistent atelectasis and/or developing infiltrate. LEFT lung is clear. No pulmonary edema. IMPRESSION: Atelectasis and/or developing infiltrate at the RIGHT lung base. Electronically Signed   By: Nolon Nations M.D.   On: 09/26/2018 17:12   Ct Angio Chest Pe W And/or Wo Contrast  Result Date: 09/26/2018 CLINICAL DATA:  Chest pain EXAM: CT ANGIOGRAPHY CHEST WITH CONTRAST TECHNIQUE: Multidetector CT imaging of the chest was performed using the standard protocol during bolus administration of intravenous contrast. Multiplanar CT image reconstructions and MIPs were obtained to evaluate the vascular anatomy. CONTRAST:  147mL OMNIPAQUE IOHEXOL 350 MG/ML SOLN COMPARISON:  None. FINDINGS: Cardiovascular: Contrast injection is sufficient to demonstrate satisfactory opacification of the pulmonary arteries to the segmental level.There is an acute pulmonary embolus involving the left lower lobe pulmonary artery with extension into segmental and subsegmental branches. There are acute appearing subsegmental pulmonary emboli involving the right upper lobe. There are acute pulmonary emboli involving the segmental and subsegmental branches of the right lower lobe. The main pulmonary artery is not dilated. The heart size is mildly enlarged. There is evidence for right-sided heart strain with an RV/LV ratio measuring approximately 1. Coronary artery calcifications are noted. There is a well-positioned right-sided Port-A-Cath with tip terminating near the cavoatrial  junction. Mediastinum/Nodes: --there is scattered calcified hilar and mediastinal lymph nodes. --No axillary lymphadenopathy. --No supraclavicular lymphadenopathy. --Normal thyroid gland. --The esophagus is unremarkable Lungs/Pleura: There are extensive bilateral pleural base nodules there are innumerable small pulmonary nodules throughout both lung fields. There is some architectural distortion of the upper lobes bilaterally. There is consolidation involving the right lower lobe. Upper Abdomen: There are innumerable low-attenuation masses throughout the liver consistent with metastatic disease. Musculoskeletal: No chest wall abnormality. No acute or significant osseous findings. Review of the MIP images confirms the above findings. IMPRESSION: 1. Bilateral acute pulmonary emboli as detailed above with CT evidence for right heart strain. Positive for acute PE with CTevidence of right heart strain (RV/LV Ratio = 1) consistent with at least  submassive (intermediate risk) PE. The presence of right heart strain has been associated with an increased risk of morbidity and mortality. 2. Consolidation involving the right lower lobe concerning for pneumonia. A developing pulmonary infarct can have a similar appearance but seems less likely. 3. Innumerable bilateral pulmonary micro nodules and pleural based nodules as detailed above. Differential considerations include hematogenous metastases or an opportunistic infection. 4. Multiple low-attenuation masses throughout the liver consistent with metastatic disease. These results were called by telephone at the time of interpretation on 09/26/2018 at 9:58 pm to Dr. Lennice Sites , who verbally acknowledged these results. Electronically Signed   By: Constance Holster M.D.   On: 09/26/2018 22:03    Scheduled Meds: . feeding supplement (ENSURE ENLIVE)  237 mL Oral BID BM  . mirtazapine  30 mg Oral QHS  . morphine  100 mg Oral Q12H  . potassium chloride SA  20 mEq Oral BID     Continuous Infusions: . azithromycin    . cefTRIAXone (ROCEPHIN)  IV 2 g (09/27/18 1600)  . heparin 1,100 Units/hr (09/27/18 0005)     Time spent: 48mins I have personally reviewed and interpreted on  09/27/2018 daily labs, tele strips, imagings as discussed above under date review session and assessment and plans.  I reviewed all nursing notes, pharmacy notes, consultant notes,  vitals, pertinent old records  I have discussed plan of care as described above with RN , patient on 09/27/2018   Florencia Reasons MD, PhD, FACP  [facility] Pager [phone]. If 7PM-7AM, please contact night-coverage at [email or web address], password [facility] 09/27/2018, 4:17 PM  LOS: 1 day

## 2018-09-27 NOTE — Consult Note (Signed)
NAME:  Scott Harper, MRN:  [id number], DOB:  August 05, 1940, LOS: 1 ADMISSION DATE:  09/26/2018, CONSULTATION DATE:  09/27/2018 REFERRING MD:  Avie Echevaria MD, CHIEF COMPLAINT: Pulmonary embolism  Brief History   78 year old with history of metastatic colon cancer with spread to the liver.  Admitted with dyspnea.  Found to have bilateral PEs.  PCCM consulted for help with management  Follows at [facility] for his colon cancer.  Last CT scan in June 2020 noted with progressive disease and has been enrolled on hospice.  As per the patient they come and check on him every week.  Past Medical History  Asthma, hypertension, colon cancer  Significant Hospital Events   9/5-admitted with PE  Consults:  PCCM  Procedures:    Significant Diagnostic Tests:  CTA 09/26/2018- left lower lobe pulmonary embolism with extension into segmental and subsegmental branches.  Subsegmental pulmonary emboli of the right upper lobe and right lower lobe.  Right lower lobe consolidation I have reviewed the images personally  Echocardiogram 09/27/2018- LVEF 60-65%.  Mildly reduced RV systolic function with mild enlargement.  Micro Data:    Antimicrobials:  Ceftriaxone, azithromycin 9/5>>   Interim history/subjective:  States that he is doing well with no dyspnea, chest pain.  Objective   Blood pressure (!) 149/80, pulse 78, temperature 99.3 F (37.4 C), temperature source Oral, resp. rate 16, height 5\' 8"  (1.727 m), weight 54.7 kg, SpO2 100 %.        Intake/Output Summary (Last 24 hours) at 09/27/2018 1058 Last data filed at 09/27/2018 0514 Gross per 24 hour  Intake 288.21 ml  Output 100 ml  Net 188.21 ml   Filed Weights   09/26/18 2321 09/27/18 0438  Weight: 59 kg 54.7 kg    Examination: Gen:      No acute distress, elderly HEENT:  EOMI, sclera anicteric, Neck:     No masses; no thyromegaly Lungs:    Clear to auscultation bilaterally; normal respiratory effort CV:         Regular rate and rhythm; no murmurs  Abd:      + bowel sounds; soft, non-tender; no palpable masses, no distension Ext:    No edema; adequate peripheral perfusion Skin:      Warm and dry; no rash Neuro: Slight confusion.  No focal deficits.  Resolved Hospital Problem list     Assessment & Plan:  Bilateral pulmonary embolism Patient is stable on room air Would not recommend lytics Continue anticoagulation  CAP Antibiotics per primary  Metastatic colon cancer Patient is already on hospice.  PCCM will be available as needed.  Please call with any questions.  Labs   CBC: Recent Labs  Lab 09/26/18 1738 09/27/18 0135 09/27/18 0814  WBC 11.4* 11.2* 10.0  NEUTROABS  --  8.2*  --   HGB 13.3 12.5* 11.5*  HCT 39.9 38.0* 35.8*  MCV 91.3 91.6 91.1  PLT 145* 123* 138*    Basic Metabolic Panel: Recent Labs  Lab 09/26/18 1738 09/27/18 0135  NA 138 137  K 3.8 3.6  CL 106 107  CO2 21* 18*  GLUCOSE 104* 106*  BUN 17 17  CREATININE 1.42* 1.32*  CALCIUM 9.4 8.8*   GFR: Estimated Creatinine Clearance: 36.3 mL/min (A) (by C-G formula based on SCr of 1.32 mg/dL (H)). Recent Labs  Lab 09/26/18 1738 09/27/18 0135 09/27/18 0814  PROCALCITON 0.44  --   --   WBC 11.4* 11.2* 10.0  LATICACIDVEN 1.6  --   --  Liver Function Tests: Recent Labs  Lab 09/26/18 1738 09/27/18 0135  AST 43* 37  ALT 33 30  ALKPHOS 139* 127*  BILITOT 1.9* 1.4*  PROT 7.0 6.6  ALBUMIN 3.7 3.5   Recent Labs  Lab 09/26/18 1738  LIPASE 18   No results for input(s): AMMONIA in the last 168 hours.  ABG No results found for: PHART, PCO2ART, PO2ART, HCO3, TCO2, ACIDBASEDEF, O2SAT   Coagulation Profile: Recent Labs  Lab 09/27/18 0135  INR 1.3*    Cardiac Enzymes: No results for input(s): CKTOTAL, CKMB, CKMBINDEX, TROPONINI in the last 168 hours.  HbA1C: No results found for: HGBA1C  CBG: No results for input(s): GLUCAP in the last 168 hours.  Review of Systems:   REVIEW OF SYSTEMS:   All negative; except for those  that are bolded, which indicate positives.  Constitutional: weight loss, weight gain, night sweats, fevers, chills, fatigue, weakness.  HEENT: headaches, sore throat, sneezing, nasal congestion, post nasal drip, difficulty swallowing, tooth/dental problems, visual complaints, visual changes, ear aches. Neuro: difficulty with speech, weakness, numbness, ataxia. CV:  chest pain, orthopnea, PND, swelling in lower extremities, dizziness, palpitations, syncope.  Resp: cough, hemoptysis, dyspnea, wheezing. GI: heartburn, indigestion, abdominal pain, nausea, vomiting, diarrhea, constipation, change in bowel habits, loss of appetite, hematemesis, melena, hematochezia.  GU: dysuria, change in color of urine, urgency or frequency, flank pain, hematuria. MSK: joint pain or swelling, decreased range of motion. Psych: change in mood or affect, depression, anxiety, suicidal ideations, homicidal ideations. Skin: rash, itching, bruising.  Past Medical History  He,  has a past medical history of Cancer (Ages).   Surgical History    Past Surgical History:  Procedure Laterality Date  . COLON SURGERY  2018  . TONSILLECTOMY       Social History   reports that he has been smoking cigarettes. He has never used smokeless tobacco. He reports current alcohol use. He reports current drug use. Drugs: Oxycodone and Morphine.   Family History   His Family history is unknown by patient.   Allergies No Known Allergies   Home Medications  Prior to Admission medications   Medication Sig Start Date End Date Taking? Authorizing Provider  HYDROcodone-acetaminophen (NORCO/VICODIN) 5-325 MG tablet Take 1 tablet by mouth every 6 (six) hours as needed for moderate pain.   Yes [provider]  mirtazapine (REMERON) 30 MG tablet Take 30 mg by mouth at bedtime.   Yes [provider]  morphine (MS CONTIN) 100 MG 12 hr tablet Take 100 mg by mouth every 12 (twelve) hours.   Yes [provider]   potassium chloride 20 MEQ TBCR Take 20 mEq by mouth 2 (two) times daily. 05/28/17  Yes Davonna Belling, MD    Marshell Garfinkel MD [facility] 09/27/2018, 11:09 AM

## 2018-09-27 NOTE — Progress Notes (Signed)
Bilateral lower extremity venous duplex has been completed. Preliminary results can be found in CV Proc through chart review.   09/27/18 4:37 PM Carlos Levering RVT

## 2018-09-27 NOTE — Progress Notes (Addendum)
[place] for IV heparin Indication: new PE  No Known Allergies  Patient Measurements: Height: 5\' 8"  (172.7 cm) Weight: 120 lb 9.6 oz (54.7 kg) IBW/kg (Calculated) : 68.4 Heparin Dosing Weight: TBW  Vital Signs: Temp: 97.9 F (36.6 C) (09/05 1209) Temp Source: Oral (09/05 1209) BP: 110/66 (09/05 1209) Pulse Rate: 62 (09/05 1209)  Labs: Recent Labs    09/26/18 1738 09/27/18 0135 09/27/18 0814 09/27/18 1806  HGB 13.3 12.5* 11.5*  --   HCT 39.9 38.0* 35.8*  --   PLT 145* 123* 138*  --   APTT  --  182*  --   --   LABPROT  --  15.6*  --   --   INR  --  1.3*  --   --   HEPARINUNFRC  --   --  0.69 0.75*  CREATININE 1.42* 1.32*  --   --   TROPONINIHS 6 7  --   --     Estimated Creatinine Clearance: 36.3 mL/min (A) (by C-G formula based on SCr of 1.32 mg/dL (H)).   Medications:  Medications Prior to Admission  Medication Sig Dispense Refill Last Dose  . HYDROcodone-acetaminophen (NORCO/VICODIN) 5-325 MG tablet Take 1 tablet by mouth every 6 (six) hours as needed for moderate pain.   09/26/2018 at Unknown time  . mirtazapine (REMERON) 30 MG tablet Take 30 mg by mouth at bedtime.   09/25/2018 at Unknown time  . morphine (MS CONTIN) 100 MG 12 hr tablet Take 100 mg by mouth every 12 (twelve) hours.   09/26/2018 at 9am  . potassium chloride 20 MEQ TBCR Take 20 mEq by mouth 2 (two) times daily. 10 tablet 0 09/26/2018 at Unknown time   Scheduled:  . feeding supplement (ENSURE ENLIVE)  237 mL Oral BID BM  . mirtazapine  30 mg Oral QHS  . morphine  100 mg Oral Q12H  . potassium chloride SA  20 mEq Oral BID   Infusions:  . azithromycin 500 mg (09/27/18 1653)  . cefTRIAXone (ROCEPHIN)  IV 2 g (09/27/18 1600)  . heparin 1,000 Units/hr (09/27/18 1943)    Assessment: 65 yoM with no PMH except for colon CA s/p chemo admitted with CP, SOB, cough. CTA shows new submassive bilateral PE with RH strain present. Pharmacy consulted to dose IV  heparin.   Baseline INR, aPTT: obtained after heparin bolus  Prior anticoagulation: none  Significant events:  Today, 09/27/2018:  CBC: Hgb slightly lower from admit but still > 10g; Plt also slightly low but stable  Most recent heparin level therapeutic although borderline high on 1100 units/hr  No bleeding or infusion issues per nursing  Goal of Therapy: Heparin level 0.3-0.7 units/ml Monitor platelets by anticoagulation protocol: Yes  Plan:  Continue heparin IV infusion at 1100 units/hr  Recheck confirmatory heparin level in 8 hrs  Daily CBC, daily heparin level once stable  Monitor for signs of bleeding or thrombosis   Reuel Boom, PharmD, BCPS [phone] 09/27/2018, 7:44 PM   ADDENDUM  Confirmatory heparin level now slightly above goal; level drawn appropriately  Reduce heparin infusion to 1000 units/hr  Recheck heparin level with AM labs  Reuel Boom, PharmD, BCPS [phone] 09/27/2018, 7:44 PM

## 2018-09-27 NOTE — Progress Notes (Signed)
PHARMACY - PHYSICIAN COMMUNICATION CRITICAL VALUE ALERT - BLOOD CULTURE IDENTIFICATION (BCID)  Scott Harper is an 78 y.o. male who presented to [facility] on 09/26/2018 with a chief complaint of CP, SOB; found to have PE +/- PNA  Assessment:  1/4 BCx bottles with GPR (include suspected source if known)  Name of physician (or Provider) Contacted: Xu  Current antibiotics: Rocephin/Zithromax  Changes to prescribed antibiotics recommended: no change; likely contaminant Patient is on recommended antibiotics - No changes needed  No results found for this or any previous visit.  Scott Harper A 09/27/2018  10:50 AM

## 2018-09-28 DIAGNOSIS — J181 Lobar pneumonia, unspecified organism: Secondary | ICD-10-CM

## 2018-09-28 LAB — CBC
HCT: 35.2 % — ABNORMAL LOW (ref 39.0–52.0)
Hemoglobin: 11.3 g/dL — ABNORMAL LOW (ref 13.0–17.0)
MCH: 29.8 pg (ref 26.0–34.0)
MCHC: 32.1 g/dL (ref 30.0–36.0)
MCV: 92.9 fL (ref 80.0–100.0)
Platelets: 159 10*3/uL (ref 150–400)
RBC: 3.79 MIL/uL — ABNORMAL LOW (ref 4.22–5.81)
RDW: 15.2 % (ref 11.5–15.5)
WBC: 9 10*3/uL (ref 4.0–10.5)
nRBC: 0 % (ref 0.0–0.2)

## 2018-09-28 LAB — HEPARIN LEVEL (UNFRACTIONATED)
Heparin Unfractionated: 0.67 IU/mL (ref 0.30–0.70)
Heparin Unfractionated: 0.62 IU/mL (ref 0.30–0.70)

## 2018-09-28 LAB — BASIC METABOLIC PANEL
Anion gap: 14 (ref 5–15)
BUN: 19 mg/dL (ref 8–23)
CO2: 22 mmol/L (ref 22–32)
Calcium: 9 mg/dL (ref 8.9–10.3)
Chloride: 106 mmol/L (ref 98–111)
Creatinine, Ser: 1.46 mg/dL — ABNORMAL HIGH (ref 0.61–1.24)
GFR calc Af Amer: 53 mL/min — ABNORMAL LOW (ref >60)
GFR calc non Af Amer: 46 mL/min — ABNORMAL LOW (ref >60)
Glucose, Bld: 98 mg/dL (ref 70–99)
Potassium: 4.2 mmol/L (ref 3.5–5.1)
Sodium: 142 mmol/L (ref 135–145)

## 2018-09-28 LAB — URINE CULTURE: Culture: 30000 — AB

## 2018-09-28 MED ORDER — APIXABAN 5 MG PO TABS: 5.0000 mg | ORAL_TABLET | Freq: Two times a day (BID) | ORAL | Status: DC

## 2018-09-28 MED ORDER — APIXABAN 5 MG PO TABS: 10.0000 mg | ORAL_TABLET | Freq: Two times a day (BID) | ORAL | 0 refills | Status: DC

## 2018-09-28 MED ORDER — APIXABAN 5 MG PO TABS
10.0000 mg | ORAL_TABLET | Freq: Two times a day (BID) | ORAL | Status: DC
  Administered 2018-09-28: 10 mg via ORAL
  Filled 2018-09-28: qty 2

## 2018-09-28 MED ORDER — AMOXICILLIN-POT CLAVULANATE 500-125 MG PO TABS
500.0000 mg | ORAL_TABLET | Freq: Two times a day (BID) | ORAL | Status: DC
  Administered 2018-09-28: 500 mg via ORAL
  Filled 2018-09-28 (×2): qty 1

## 2018-09-28 MED ORDER — APIXABAN 5 MG PO TABS: 5.0000 mg | ORAL_TABLET | Freq: Two times a day (BID) | ORAL | 0 refills | Status: AC

## 2018-09-28 MED ORDER — HEPARIN SOD (PORK) LOCK FLUSH 100 UNIT/ML IV SOLN
500.0000 [IU] | INTRAVENOUS | Status: AC | PRN
  Administered 2018-09-28: 500 [IU]

## 2018-09-28 MED ORDER — AMOXICILLIN-POT CLAVULANATE 500-125 MG PO TABS: 500.0000 mg | ORAL_TABLET | Freq: Two times a day (BID) | ORAL | 0 refills | Status: DC

## 2018-09-28 MED ORDER — APIXABAN 5 MG PO TABS: 5.0000 mg | ORAL_TABLET | Freq: Two times a day (BID) | ORAL | 0 refills | Status: DC

## 2018-09-28 MED ORDER — AMOXICILLIN-POT CLAVULANATE 500-125 MG PO TABS: 500.0000 mg | ORAL_TABLET | Freq: Two times a day (BID) | ORAL | 0 refills | Status: AC

## 2018-09-28 NOTE — Progress Notes (Signed)
[place] for IV heparin Indication: new PE  No Known Allergies  Patient Measurements: Height: 5\' 8"  (172.7 cm) Weight: 120 lb 9.6 oz (54.7 kg) IBW/kg (Calculated) : 68.4 Heparin Dosing Weight: TBW  Vital Signs: Temp: 100.2 F (37.9 C) (09/05 2200) Temp Source: Oral (09/05 2200) BP: 120/71 (09/05 2200) Pulse Rate: 84 (09/05 2200)  Labs: Recent Labs    09/26/18 1738 09/27/18 0135 09/27/18 0814 09/27/18 1806 09/28/18 0356  HGB 13.3 12.5* 11.5*  --  11.3*  HCT 39.9 38.0* 35.8*  --  35.2*  PLT 145* 123* 138*  --  159  APTT  --  182*  --   --   --   LABPROT  --  15.6*  --   --   --   INR  --  1.3*  --   --   --   HEPARINUNFRC  --   --  0.69 0.75* 0.67  CREATININE 1.42* 1.32*  --   --  1.46*  TROPONINIHS 6 7  --   --   --     Estimated Creatinine Clearance: 32.8 mL/min (A) (by C-G formula based on SCr of 1.46 mg/dL (H)).   Medications:  Medications Prior to Admission  Medication Sig Dispense Refill Last Dose  . HYDROcodone-acetaminophen (NORCO/VICODIN) 5-325 MG tablet Take 1 tablet by mouth every 6 (six) hours as needed for moderate pain.   09/26/2018 at Unknown time  . mirtazapine (REMERON) 30 MG tablet Take 30 mg by mouth at bedtime.   09/25/2018 at Unknown time  . morphine (MS CONTIN) 100 MG 12 hr tablet Take 100 mg by mouth every 12 (twelve) hours.   09/26/2018 at 9am  . potassium chloride 20 MEQ TBCR Take 20 mEq by mouth 2 (two) times daily. 10 tablet 0 09/26/2018 at Unknown time   Scheduled:  . feeding supplement (ENSURE ENLIVE)  237 mL Oral BID BM  . mirtazapine  30 mg Oral QHS  . morphine  100 mg Oral Q12H  . potassium chloride SA  20 mEq Oral BID   Infusions:  . azithromycin 500 mg (09/27/18 1653)  . cefTRIAXone (ROCEPHIN)  IV 2 g (09/27/18 1600)  . heparin 1,000 Units/hr (09/27/18 1943)    Assessment: 55 yoM with no PMH except for colon CA s/p chemo admitted with CP, SOB, cough. CTA shows new submassive bilateral PE with  RH strain present. Pharmacy consulted to dose IV heparin.   Baseline INR, aPTT: obtained after heparin bolus  Prior anticoagulation: none  Significant events: 9/5  CBC: Hgb slightly lower from admit but still > 10g; Plt also slightly low but stable  Most recent heparin level therapeutic although borderline high on 1100 units/hr  No bleeding or infusion issues per nursing Today, 9/6  0356 HL = 0.67 at goal, no bleeding or infusion issues per RN.   Goal of Therapy: Heparin level 0.3-0.7 units/ml Monitor platelets by anticoagulation protocol: Yes  Plan:  Continue heparin IV infusion at 1000 units/hr  Daily CBC, daily heparin level   Monitor for signs of bleeding or thrombosis    Dorrene German 09/28/2018, 4:34 AM

## 2018-09-28 NOTE — Progress Notes (Signed)
[place] for IV heparin Indication: new PE  No Known Allergies  Patient Measurements: Height: 5\' 8"  (172.7 cm) Weight: 124 lb 12.5 oz (56.6 kg) IBW/kg (Calculated) : 68.4 Heparin Dosing Weight: TBW  Vital Signs: Temp: 98.9 F (37.2 C) (09/06 0517) Temp Source: Oral (09/06 0517) BP: 115/75 (09/06 0517) Pulse Rate: 96 (09/06 0517)  Labs: Recent Labs    09/26/18 1738 09/27/18 0135  09/27/18 0814 09/27/18 1806 09/28/18 0356 09/28/18 1000  HGB 13.3 12.5*  --  11.5*  --  11.3*  --   HCT 39.9 38.0*  --  35.8*  --  35.2*  --   PLT 145* 123*  --  138*  --  159  --   APTT  --  182*  --   --   --   --   --   LABPROT  --  15.6*  --   --   --   --   --   INR  --  1.3*  --   --   --   --   --   HEPARINUNFRC  --   --    < > 0.69 0.75* 0.67 0.62  CREATININE 1.42* 1.32*  --   --   --  1.46*  --   TROPONINIHS 6 7  --   --   --   --   --    < > = values in this interval not displayed.    Estimated Creatinine Clearance: 33.9 mL/min (A) (by C-G formula based on SCr of 1.46 mg/dL (H)).   Medications:  Medications Prior to Admission  Medication Sig Dispense Refill Last Dose  . HYDROcodone-acetaminophen (NORCO/VICODIN) 5-325 MG tablet Take 1 tablet by mouth every 6 (six) hours as needed for moderate pain.   09/26/2018 at Unknown time  . mirtazapine (REMERON) 30 MG tablet Take 30 mg by mouth at bedtime.   09/25/2018 at Unknown time  . morphine (MS CONTIN) 100 MG 12 hr tablet Take 100 mg by mouth every 12 (twelve) hours.   09/26/2018 at 9am  . potassium chloride 20 MEQ TBCR Take 20 mEq by mouth 2 (two) times daily. 10 tablet 0 09/26/2018 at Unknown time   Scheduled:  . amoxicillin-clavulanate  500 mg Oral BID  . feeding supplement (ENSURE ENLIVE)  237 mL Oral BID BM  . mirtazapine  30 mg Oral QHS  . morphine  100 mg Oral Q12H  . potassium chloride SA  20 mEq Oral BID   Infusions:  . heparin 1,000 Units/hr (09/27/18 1943)    Assessment: 63 yoM with  no PMH except for colon CA s/p chemo admitted with CP, SOB, cough. CTA shows new submassive bilateral PE with RH strain present. Pharmacy consulted to dose IV heparin.   Baseline INR, aPTT: obtained after heparin bolus  Prior anticoagulation: none  Significant events:  Today, 09/28/2018:  CBC: Hgb low but stable; Plt improved to WNL  Most recent heparin level therapeutic on 1100 units/hr  No bleeding or infusion issues per nursing  Transition to Eliquis today  Plan:  Start Eliquis 10 mg PO bid x 7 days, then 5 mg bid thereafter  Stop heparin with first dose of Eliquis  Pharmacy to educate patient prior to discharge   Reuel Boom, PharmD, BCPS [phone] 09/28/2018, 11:31 AM

## 2018-09-28 NOTE — Discharge Instructions (Signed)
Information on my medicine - ELIQUIS (apixaban)  This medication education was reviewed with me or my healthcare representative as part of my discharge preparation.  The pharmacist that spoke with me during my hospital stay was:  Westlee Devita A, RPH  Why was Eliquis prescribed for you? Eliquis was prescribed to treat blood clots that may have been found in the veins of your legs (deep vein thrombosis) or in your lungs (pulmonary embolism) and to reduce the risk of them occurring again.  What do You need to know about Eliquis ? The starting dose is 10 mg (two 5 mg tablets) taken TWICE daily for the FIRST SEVEN (7) DAYS, then on (enter date)  10/05/2018  the dose is reduced to ONE 5 mg tablet taken TWICE daily.  Eliquis may be taken with or without food.   Try to take the dose about the same time in the morning and in the evening. If you have difficulty swallowing the tablet whole please discuss with your pharmacist how to take the medication safely.  Take Eliquis exactly as prescribed and DO NOT stop taking Eliquis without talking to the doctor who prescribed the medication.  Stopping may increase your risk of developing a new blood clot.  Refill your prescription before you run out.  After discharge, you should have regular check-up appointments with your healthcare provider that is prescribing your Eliquis.    What do you do if you miss a dose? If a dose of ELIQUIS is not taken at the scheduled time, take it as soon as possible on the same day and twice-daily administration should be resumed. The dose should not be doubled to make up for a missed dose.  Important Safety Information A possible side effect of Eliquis is bleeding. You should call your healthcare provider right away if you experience any of the following: ? Bleeding from an injury or your nose that does not stop. ? Unusual colored urine (red or dark brown) or unusual colored stools (red or black). ? Unusual bruising  for unknown reasons. ? A serious fall or if you hit your head (even if there is no bleeding).  Some medicines may interact with Eliquis and might increase your risk of bleeding or clotting while on Eliquis. To help avoid this, consult your healthcare provider or pharmacist prior to using any new prescription or non-prescription medications, including herbals, vitamins, non-steroidal anti-inflammatory drugs (NSAIDs) and supplements.  This website has more information on Eliquis (apixaban): http://www.eliquis.com/eliquis/home

## 2018-09-28 NOTE — Progress Notes (Signed)
Initial Nutrition Assessment  DOCUMENTATION CODES:   Not applicable  INTERVENTION:  - continue Ensure Enlive BID, each supplement provides 350 kcal and 20 grams protein. - continue to encourage PO intakes.    NUTRITION DIAGNOSIS:   Increased nutrient needs related to acute illness as evidenced by estimated needs.  GOAL:   Patient will meet greater than or equal to 90% of their needs  MONITOR:   PO intake, Supplement acceptance, Labs, Weight trends  REASON FOR ASSESSMENT:   Malnutrition Screening Tool  ASSESSMENT:   78 y.o. male with history of colon cancer s/p surgery 2 years ago in [place] and had followed up with [facility] for chemo presently not on any medication. He presented to the ED on 9/4 due to 2 days of R-sided chest pain, SOB subjective feeling of fever, and intermittent cough. Pain and SOB are worsened with walking. In the ED he had a fever of 100.4 F and CXR showed some infiltrates concerning for PNA.  Patient is noted to currently be out of the room to Vascular Lab. Per flow sheet, he consumed 100% of breakfast and lunch yesterday. Per chart review, current weight is 125 lb and PTA the only other weight recorded is from 01/31/18 when he weighed 130 lb. This indicates 5 lb weight loss (3.8% body weight) in the past 7.5 months; not significant for time frame but unable to determine if weight loss has occurred more acutely. Ensure Enlive ordered BID and patient accepted 2 bottles of the supplement offered and declined a bottle d/t feeling nauseated, per review of order.  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  Medications reviewed; 20 mEq K-Dur BID.      NUTRITION - FOCUSED PHYSICAL  EXAM:  unable to complete at this time.   Diet Order:   Diet Order            Diet Heart Room service appropriate? Yes; Fluid consistency: Thin  Diet effective now              EDUCATION NEEDS:   No education needs have been identified at this time  Skin:  Skin Assessment: Reviewed RN Assessment  Last BM:  9/5  Height:   Ht Readings from Last 1 Encounters:  09/26/18 5\' 8"  (1.727 m)    Weight:   Wt Readings from Last 1 Encounters:  09/28/18 56.6 kg    Ideal Body Weight:  70 kg  BMI:  Body mass index is 18.97 kg/m.  Estimated Nutritional Needs:   Kcal:  1980-2150 kcal  Protein:  100-110 grams  Fluid:  >/= 2 L/day     Scott Matin, MS, RD, LDN, [facility] Inpatient Clinical Dietitian Pager # [phone] After hours/weekend pager # [phone]

## 2018-09-28 NOTE — Discharge Summary (Addendum)
Discharge Summary  Scott Harper [id number] DOB: Mar 15, 1940  PCP: Patient, No Pcp Per  Admit date: 09/26/2018 Discharge date: 09/28/2018  Time spent: 32mins, more than 50% time spent on coordination of care.  Recommendations for Outpatient Follow-up:  1. F/u with community home care and hospice [phone]) 2. 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    Discharge Condition: stable  Diet recommendation: regular diet   Filed Weights   09/26/18 2321 09/27/18 0438 09/28/18 0517  Weight: 59 kg 54.7 kg 56.6 kg    History of present illness: ( per admitting MD Dr Hal Hope)  Patient coming from: Home.  Chief Complaint: Chest pain shortness of breath.  HPI: Scott Harper is a 78 y.o. male with history of colon cancer status post surgery 2 years ago in [place] as per the patient and had followed up with [facility] for chemo presently not on any medication presents to the ER because of 2 days of right-sided chest pain with shortness of breath subjective feeling of fever and some cough.  Denies any dizziness loss of consciousness.  Pain increased on walking along with shortness of breath.  ED Course: In the ER patient was mildly febrile with temperature of 100.4 F tachycardic and chest x-ray showing some infiltrates concerning for pneumonia.  Since patient's d-dimer was elevated CT angiogram was done which shows bilateral pocket pulmonary bolus him with strain pattern.  ER physician discussed with on-call pulmonary critical  care who advised at this time since patient is hemodynamically stable to keep patient on heparin and check 2D echo.  EKG shows sinus tachycardia with history changes and ST depression.  Troponins were negative. Hospital Course:  Principal Problem:   Acute pulmonary embolism ([place]) Active Problems:   CAP (community acquired pneumonia)   ARF (acute renal failure) ([place])   Metastatic cancer ([place])   Hospice care patient   CKD (chronic kidney disease), stage III ([place])   Hypokalemia   Lobar pneumonia ([facility])   Acute bilateral pulmonary embolism with strain pattern in the setting of metastatic cancer  - -negative troponin, echo lvef wnl, The right ventricle has mildly reduced systolic function. The cavity was mildly enlarged - bilateral lower extremity venous doppler no DVT  - pulmonary/critical care input appreciated, no lytics.  he is hemodynamically stable, he is treated with  heparin drip, -right sided pleuretic chest pain resolved, he is feeling better, he wants to go home, he is discharged on eliquis 10mg  bid for 7 days then 5mg  bid .   Possible pneumonia versus pulmonary infarct seen in the CAT scan for which patient on empiric antibiotics. blood  Cultures no growth ,  ua unremarkable, COVID-19 screening negative t max 100.7, Wbc on presentation is 11.4, procalcitonin 0.44 He received rocephin/zithro in the hospital ,fever resolved, leukocytosis resolved, he is discharged on augmentinx5 days  to finish abx course.   Blood culture gram positive rods /bacillus species in aerobic bottle only, case discussed with infectious disease who recommend no treatment due to likely  a contaminant .  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 Patient is not able to  provide detail.  I have verified that He is active with community home care and hospice [facility] [phone])  They are not aware of hospitalization though   hospice agency states it is ok to discharge patient home on anticoagulation, they will continue to follow patient.  Chronic pain:  Continue home regimen, currently he denies pain     Code Status: DNR  Family Communication: patient,  patient's daughter's phone number is [phone], called twice, left a  message  Disposition Plan: home  and continue home hospice  Consultants:  Pulm/critical care  Procedures:  none  Antibiotics:  As above   Discharge Exam: BP 123/73 (BP Location: Right Arm)    Pulse (!) 101    Temp 100 F (37.8 C) (Oral)    Resp 13    Ht 5\' 8"  (1.727 m)    Wt 56.6 kg    SpO2 95%    BMI 18.97 kg/m   General: NAD, AAOx3 Cardiovascular: RRR Respiratory: CTABL  Discharge Instructions You were cared for by a hospitalist during your hospital stay. If you have any questions about your discharge medications or the care you received while you were in the hospital after you are discharged, you can call the unit and asked to speak with the hospitalist on call if the hospitalist that took care of you is not available. Once you are discharged, your primary care physician will handle any further medical issues. Please note that NO REFILLS for any discharge medications will be authorized once you are discharged, as it is imperative that you return to your primary care physician (or establish a relationship with a primary care physician if you do not have one) for your aftercare needs so that they can reassess your need for medications and monitor your lab values.  Discharge Instructions    Diet general   Complete by: As directed    Increase activity slowly   Complete by: As directed      Allergies as of 09/28/2018   No Known Allergies     Medication List    TAKE these medications     amoxicillin-clavulanate 500-125 MG tablet Commonly known as: AUGMENTIN Take 1 tablet (500 mg total) by mouth 2 (two) times daily for 5 days.   apixaban 5 MG Tabs tablet Commonly known as: ELIQUIS Take 2 tablets (10 mg total) by mouth 2 (two) times daily for 7 days.   apixaban 5 MG Tabs tablet Commonly known as: ELIQUIS Take 1 tablet (5 mg total) by mouth 2 (two) times daily. Start taking on: October 05, 2018   HYDROcodone-acetaminophen 5-325 MG tablet Commonly known as: NORCO/VICODIN Take 1 tablet by mouth every 6 (six) hours as needed for moderate pain.   mirtazapine 30 MG tablet Commonly known as: REMERON Take 30 mg by mouth at bedtime. Notes to patient: 09/28/2018 bedtime    morphine 100 MG 12 hr tablet Commonly known as: MS CONTIN Take 100 mg by mouth every 12 (twelve) hours. Notes to patient: 09/28/2018 evening    Potassium Chloride ER 20 MEQ Tbcr Take 20 mEq by mouth 2 (two) times daily. Notes to patient: 09/28/2018 evening  No Known Allergies Follow-up Information    Hospice, [facility].   Contact information: [place] [address] [phone]            The results of significant diagnostics from this hospitalization (including imaging, microbiology, ancillary and laboratory) are listed below for reference.    Significant Diagnostic Studies: Dg Chest 2 View  Result Date: 09/26/2018 CLINICAL DATA:  Pt reported chest pains and SOB since last night. Medical hx of colon cancer. Current smoker EXAM: CHEST - 2 VIEW COMPARISON:  None. FINDINGS: The patient has a RIGHT-sided PowerPort, tip overlying the level of the superior vena cava. Heart size is normal. Aorta is mildly tortuous. There are patchy streaky changes at the RIGHT lung base, consistent atelectasis and/or developing infiltrate. LEFT lung is clear. No pulmonary edema. IMPRESSION: Atelectasis and/or developing infiltrate at the RIGHT lung base.  Electronically Signed   By: Nolon Nations M.D.   On: 09/26/2018 17:12   Ct Angio Chest Pe W And/or Wo Contrast  Result Date: 09/26/2018 CLINICAL DATA:  Chest pain EXAM: CT ANGIOGRAPHY CHEST WITH CONTRAST TECHNIQUE: Multidetector CT imaging of the chest was performed using the standard protocol during bolus administration of intravenous contrast. Multiplanar CT image reconstructions and MIPs were obtained to evaluate the vascular anatomy. CONTRAST:  162mL OMNIPAQUE IOHEXOL 350 MG/ML SOLN COMPARISON:  None. FINDINGS: Cardiovascular: Contrast injection is sufficient to demonstrate satisfactory opacification of the pulmonary arteries to the segmental level.There is an acute pulmonary embolus involving the left lower lobe pulmonary artery with extension into segmental and subsegmental branches. There are acute appearing subsegmental pulmonary emboli involving the right upper lobe. There are acute pulmonary emboli involving the segmental and subsegmental branches of the right lower lobe. The main pulmonary artery is not dilated. The heart size is mildly enlarged. There is evidence for right-sided heart strain with an RV/LV ratio measuring approximately 1. Coronary artery calcifications are noted. There is a well-positioned right-sided Port-A-Cath with tip terminating near the cavoatrial junction. Mediastinum/Nodes: --there is scattered calcified hilar and mediastinal lymph nodes. --No axillary lymphadenopathy. --No supraclavicular lymphadenopathy. --Normal thyroid gland. --The esophagus is unremarkable Lungs/Pleura: There are extensive bilateral pleural base nodules there are innumerable small pulmonary nodules throughout both lung fields. There is some architectural distortion of the upper lobes bilaterally. There is consolidation involving the right lower lobe. Upper Abdomen: There are innumerable low-attenuation masses throughout the liver consistent with metastatic disease. Musculoskeletal: No chest wall  abnormality. No acute or significant osseous findings. Review of the MIP images confirms the above findings. IMPRESSION: 1. Bilateral acute pulmonary emboli as detailed above with CT evidence for right heart strain. Positive for acute PE with CTevidence of right heart strain (RV/LV Ratio = 1) consistent with at least submassive (intermediate risk) PE. The presence of right heart strain has been associated with an increased risk of morbidity and mortality. 2. Consolidation involving the right lower lobe concerning for pneumonia. A developing pulmonary infarct can have a similar appearance but seems less likely. 3. Innumerable bilateral pulmonary micro nodules and pleural based nodules as detailed above. Differential considerations include hematogenous metastases or an opportunistic infection. 4. Multiple low-attenuation masses throughout the liver consistent with metastatic disease. These results were called by telephone at the time of interpretation on 09/26/2018 at 9:58 pm to Dr. Lennice Sites , who verbally acknowledged these results. Electronically Signed   By: Constance Holster M.D.   On: 09/26/2018 22:03   Vas [place] Lower Extremity Venous (dvt)  Result Date: 09/27/2018  Lower Venous Study Indications: Pulmonary embolism.  Risk Factors: None identified. Comparison Study: No prior studies. Performing Technologist: Oliver Hum RVT  Examination Guidelines: A complete evaluation includes B-mode imaging, spectral Doppler, color Doppler, and power Doppler as needed of all accessible portions of each vessel. Bilateral testing is considered an integral part of a complete examination. Limited examinations for reoccurring indications may be performed as noted.  +---------+---------------+---------+-----------+----------+--------------+  RIGHT     Compressibility Phasicity Spontaneity Properties Thrombus Aging  +---------+---------------+---------+-----------+----------+--------------+  CFV       Full            Yes        Yes                                    +---------+---------------+---------+-----------+----------+--------------+  SFJ       Full                                                             +---------+---------------+---------+-----------+----------+--------------+  FV Prox   Full                                                             +---------+---------------+---------+-----------+----------+--------------+  FV Mid    Full                                                             +---------+---------------+---------+-----------+----------+--------------+  FV Distal Full                                                             +---------+---------------+---------+-----------+----------+--------------+  PFV       Full                                                             +---------+---------------+---------+-----------+----------+--------------+  POP       Full            Yes       Yes                                    +---------+---------------+---------+-----------+----------+--------------+  PTV       Full                                                             +---------+---------------+---------+-----------+----------+--------------+  PERO      Full                                                             +---------+---------------+---------+-----------+----------+--------------+   +---------+---------------+---------+-----------+----------+--------------+  LEFT      Compressibility Phasicity Spontaneity Properties Thrombus Aging  +---------+---------------+---------+-----------+----------+--------------+  CFV       Full            Yes       Yes                                    +---------+---------------+---------+-----------+----------+--------------+  SFJ       Full                                                             +---------+---------------+---------+-----------+----------+--------------+  FV Prox   Full                                                              +---------+---------------+---------+-----------+----------+--------------+  FV Mid    Full                                                             +---------+---------------+---------+-----------+----------+--------------+  FV Distal Full                                                             +---------+---------------+---------+-----------+----------+--------------+  PFV       Full                                                             +---------+---------------+---------+-----------+----------+--------------+  POP       Full            Yes       Yes                                    +---------+---------------+---------+-----------+----------+--------------+  PTV       Full                                                             +---------+---------------+---------+-----------+----------+--------------+  PERO      Full                                                             +---------+---------------+---------+-----------+----------+--------------+     Summary: Right: There is no evidence of deep vein thrombosis in the lower extremity. No cystic structure found in the popliteal fossa. Left: There is no evidence of deep vein thrombosis in the lower extremity. No cystic structure found in the popliteal fossa.  *See table(s) above for measurements and observations.    Preliminary     Microbiology: Recent Results (from the past 240 hour(s))  Blood Culture (routine x 2)     Status: None (Preliminary result)   Collection Time: 09/26/18  5:28 PM   Specimen: BLOOD  Result Value Ref Range Status   Specimen Description   Final    BLOOD LEFT WRIST Performed at [facility] [address], [place], [place] [address]    Special Requests   Final    BOTTLES DRAWN AEROBIC AND ANAEROBIC Blood Culture adequate volume Performed at [facility] [address], [place], [place] [address]    Culture   Final    NO GROWTH 2 DAYS Performed at [facility] [address], [place], [place] [address]    Report Status PENDING  Incomplete  Blood Culture (routine x 2)     Status: Abnormal (Preliminary result)   Collection Time: 09/26/18  5:28 PM   Specimen: BLOOD  Result Value Ref Range Status   Specimen Description   Final    BLOOD LEFT ANTECUBITAL Performed at [facility] [address], [place], [place] [address]    Special Requests   Final    BOTTLES DRAWN AEROBIC ONLY Blood Culture results may not be optimal due to an inadequate volume of blood received in culture bottles Performed at [facility] [address] [place], [place] [address]    Culture  Setup Time   Final    GRAM POSITIVE RODS AEROBIC BOTTLE ONLY CRITICAL RESULT CALLED TO, READ BACK BY AND VERIFIED WITH: Otila Back [id number] Performed at [facility] [address], [place], [place] [address]    Culture BACILLUS SPECIES (A)  Final   Report Status PENDING  Incomplete  SARS Coronavirus 2 [facility] order, Performed in [facility] hospital lab) Nasopharyngeal Nasopharyngeal Swab     Status: None   Collection Time: 09/26/18  5:38 PM   Specimen: Nasopharyngeal Swab  Result Value Ref Range Status   SARS Coronavirus 2 NEGATIVE NEGATIVE Final    Comment: (NOTE) If result is NEGATIVE SARS-CoV-2 target nucleic acids are NOT DETECTED. The SARS-CoV-2 RNA is generally detectable in upper and lower  respiratory specimens during the acute phase of infection. The lowest  concentration of SARS-CoV-2 viral copies this assay can detect is 250  copies / mL. A negative result does not preclude SARS-CoV-2 infection  and should not be used as the sole basis for treatment or other  patient management decisions.  A negative result may occur with  improper specimen collection / handling, submission of specimen other  than nasopharyngeal swab, presence of viral mutation(s) within the  areas targeted by this assay, and  inadequate number of viral  copies  (<250 copies / mL). A negative result must be combined with clinical  observations, patient history, and epidemiological information. If result is POSITIVE SARS-CoV-2 target nucleic acids are DETECTED. The SARS-CoV-2 RNA is generally detectable in upper and lower  respiratory specimens dur ing the acute phase of infection.  Positive  results are indicative of active infection with SARS-CoV-2.  Clinical  correlation with patient history and other diagnostic information is  necessary to determine patient infection status.  Positive results do  not rule out bacterial infection or co-infection with other viruses. If result is PRESUMPTIVE POSTIVE SARS-CoV-2 nucleic acids MAY BE PRESENT.   A presumptive positive result was obtained on the submitted specimen  and confirmed on repeat testing.  While 2019 novel coronavirus  (SARS-CoV-2) nucleic acids may be present in the submitted sample  additional confirmatory testing may be necessary for epidemiological  and / or clinical management purposes  to differentiate between  SARS-CoV-2 and other Sarbecovirus currently known to infect humans.  If clinically indicated additional testing with an alternate test  methodology [phone]) is advised. The SARS-CoV-2 RNA is generally  detectable in upper and lower respiratory sp ecimens during the acute  phase of infection. The expected result is Negative. Fact Sheet for Patients:  StrictlyIdeas.no Fact Sheet for Healthcare Providers: BankingDealers.co.za This test is not yet approved or cleared by the [place] FDA and has been authorized for detection and/or diagnosis of SARS-CoV-2 by FDA under an Emergency Use Authorization (EUA).  This EUA will remain in effect (meaning this test can be used) for the duration of the COVID-19 declaration under Section 564(b)(1) of the Act, 21 U.S.C. section 360bbb-3(b)(1), unless the  authorization is terminated or revoked sooner. Performed at [facility], [place] [address] [place], [place] [address]   Urine culture     Status: Abnormal   Collection Time: 09/26/18  5:38 PM   Specimen: Urine, Clean Catch  Result Value Ref Range Status   Specimen Description   Final    URINE, CLEAN CATCH Performed at [facility], [place] [address], [place], [place] [address]    Special Requests   Final    NONE Performed at [facility], [place] [address], [place], [place] [address]    Culture (A)  Final    30,000 COLONIES/mL MULTIPLE SPECIES PRESENT, SUGGEST RECOLLECTION   Report Status 09/28/2018 FINAL  Final  SARS Coronavirus 2 [facility] order, Performed in [facility] hospital lab) Nasopharyngeal Nasopharyngeal Swab     Status: None   Collection Time: 09/26/18 10:14 PM   Specimen: Nasopharyngeal Swab  Result Value Ref Range Status   SARS Coronavirus 2 NEGATIVE NEGATIVE Final    Comment: (NOTE) If result is NEGATIVE SARS-CoV-2 target nucleic acids are NOT DETECTED. The SARS-CoV-2 RNA is generally detectable in upper and lower  respiratory specimens during the acute phase of infection. The lowest  concentration of SARS-CoV-2 viral copies this assay can detect is 250  copies / mL. A negative result does not preclude SARS-CoV-2 infection  and should not be used as the sole basis for treatment or other  patient management decisions.  A negative result may occur with  improper specimen collection / handling, submission of specimen other  than nasopharyngeal swab, presence of viral mutation(s) within the  areas targeted by this assay, and inadequate number of viral copies  (<250 copies / mL). A negative result must be combined with clinical  observations, patient history, and epidemiological information. If result  is POSITIVE SARS-CoV-2 target nucleic acids are DETECTED. The SARS-CoV-2 RNA is generally detectable in upper and  lower  respiratory specimens dur ing the acute phase of infection.  Positive  results are indicative of active infection with SARS-CoV-2.  Clinical  correlation with patient history and other diagnostic information is  necessary to determine patient infection status.  Positive results do  not rule out bacterial infection or co-infection with other viruses. If result is PRESUMPTIVE POSTIVE SARS-CoV-2 nucleic acids MAY BE PRESENT.   A presumptive positive result was obtained on the submitted specimen  and confirmed on repeat testing.  While 2019 novel coronavirus  (SARS-CoV-2) nucleic acids may be present in the submitted sample  additional confirmatory testing may be necessary for epidemiological  and / or clinical management purposes  to differentiate between  SARS-CoV-2 and other Sarbecovirus currently known to infect humans.  If clinically indicated additional testing with an alternate test  methodology [phone]) is advised. The SARS-CoV-2 RNA is generally  detectable in upper and lower respiratory sp ecimens during the acute  phase of infection. The expected result is Negative. Fact Sheet for Patients:  StrictlyIdeas.no Fact Sheet for Healthcare Providers: BankingDealers.co.za This test is not yet approved or cleared by the [place] FDA and has been authorized for detection and/or diagnosis of SARS-CoV-2 by FDA under an Emergency Use Authorization (EUA).  This EUA will remain in effect (meaning this test can be used) for the duration of the COVID-19 declaration under Section 564(b)(1) of the Act, 21 U.S.C. section 360bbb-3(b)(1), unless the authorization is terminated or revoked sooner. Performed at [facility], [place] [address] [place], [place] [address]      Labs: Basic Metabolic Panel: Recent Labs  Lab 09/26/18 1738 09/27/18 0135 09/28/18 0356  NA 138 137 142  K 3.8 3.6 4.2  CL 106 107 106  CO2  21* 18* 22  GLUCOSE 104* 106* 98  BUN 17 17 19   CREATININE 1.42* 1.32* 1.46*  CALCIUM 9.4 8.8* 9.0   Liver Function Tests: Recent Labs  Lab 09/26/18 1738 09/27/18 0135  AST 43* 37  ALT 33 30  ALKPHOS 139* 127*  BILITOT 1.9* 1.4*  PROT 7.0 6.6  ALBUMIN 3.7 3.5   Recent Labs  Lab 09/26/18 1738  LIPASE 18   No results for input(s): AMMONIA in the last 168 hours. CBC: Recent Labs  Lab 09/26/18 1738 09/27/18 0135 09/27/18 0814 09/28/18 0356  WBC 11.4* 11.2* 10.0 9.0  NEUTROABS  --  8.2*  --   --   HGB 13.3 12.5* 11.5* 11.3*  HCT 39.9 38.0* 35.8* 35.2*  MCV 91.3 91.6 91.1 92.9  PLT 145* 123* 138* 159   Cardiac Enzymes: No results for input(s): CKTOTAL, CKMB, CKMBINDEX, TROPONINI in the last 168 hours. BNP: BNP (last 3 results) No results for input(s): BNP in the last 8760 hours.  ProBNP (last 3 results) No results for input(s): PROBNP in the last 8760 hours.  CBG: No results for input(s): GLUCAP in the last 168 hours.     Signed:  Florencia Reasons MD, PhD, FACP  [facility] 09/28/2018, 3:46 PM

## 2018-09-29 LAB — CULTURE, BLOOD (ROUTINE X 2)

## 2018-10-01 LAB — CULTURE, BLOOD (ROUTINE X 2)
Culture: NO GROWTH
Special Requests: ADEQUATE

## 2018-10-27 ENCOUNTER — Inpatient Hospital Stay (HOSPITAL_COMMUNITY)
Admission: EM | Admit: 2018-10-27 | Discharge: 2018-11-23 | DRG: 377 | Disposition: E | Payer: Medicare Other | Attending: Internal Medicine | Admitting: Internal Medicine

## 2018-10-27 ENCOUNTER — Emergency Department (HOSPITAL_COMMUNITY): Payer: Medicare Other

## 2018-10-27 ENCOUNTER — Other Ambulatory Visit: Payer: Self-pay

## 2018-10-27 DIAGNOSIS — R0602 Shortness of breath: Secondary | ICD-10-CM | POA: Diagnosis not present

## 2018-10-27 DIAGNOSIS — K922 Gastrointestinal hemorrhage, unspecified: Secondary | ICD-10-CM | POA: Diagnosis not present

## 2018-10-27 DIAGNOSIS — Z66 Do not resuscitate: Secondary | ICD-10-CM | POA: Diagnosis not present

## 2018-10-27 DIAGNOSIS — I2782 Chronic pulmonary embolism: Secondary | ICD-10-CM | POA: Diagnosis not present

## 2018-10-27 DIAGNOSIS — K5791 Diverticulosis of intestine, part unspecified, without perforation or abscess with bleeding: Principal | ICD-10-CM | POA: Diagnosis present

## 2018-10-27 DIAGNOSIS — I959 Hypotension, unspecified: Secondary | ICD-10-CM | POA: Diagnosis not present

## 2018-10-27 DIAGNOSIS — R0902 Hypoxemia: Secondary | ICD-10-CM | POA: Diagnosis not present

## 2018-10-27 DIAGNOSIS — C189 Malignant neoplasm of colon, unspecified: Secondary | ICD-10-CM | POA: Diagnosis present

## 2018-10-27 DIAGNOSIS — Z79899 Other long term (current) drug therapy: Secondary | ICD-10-CM | POA: Diagnosis not present

## 2018-10-27 DIAGNOSIS — I2609 Other pulmonary embolism with acute cor pulmonale: Secondary | ICD-10-CM

## 2018-10-27 DIAGNOSIS — Z7189 Other specified counseling: Secondary | ICD-10-CM | POA: Diagnosis not present

## 2018-10-27 DIAGNOSIS — Z23 Encounter for immunization: Secondary | ICD-10-CM | POA: Diagnosis not present

## 2018-10-27 DIAGNOSIS — Z515 Encounter for palliative care: Secondary | ICD-10-CM

## 2018-10-27 DIAGNOSIS — Z7951 Long term (current) use of inhaled steroids: Secondary | ICD-10-CM | POA: Diagnosis not present

## 2018-10-27 DIAGNOSIS — I2699 Other pulmonary embolism without acute cor pulmonale: Secondary | ICD-10-CM | POA: Diagnosis not present

## 2018-10-27 DIAGNOSIS — Z9049 Acquired absence of other specified parts of digestive tract: Secondary | ICD-10-CM | POA: Diagnosis not present

## 2018-10-27 DIAGNOSIS — R64 Cachexia: Secondary | ICD-10-CM | POA: Diagnosis not present

## 2018-10-27 DIAGNOSIS — F17210 Nicotine dependence, cigarettes, uncomplicated: Secondary | ICD-10-CM | POA: Diagnosis present

## 2018-10-27 DIAGNOSIS — C787 Secondary malignant neoplasm of liver and intrahepatic bile duct: Secondary | ICD-10-CM | POA: Diagnosis present

## 2018-10-27 DIAGNOSIS — I82412 Acute embolism and thrombosis of left femoral vein: Secondary | ICD-10-CM | POA: Diagnosis present

## 2018-10-27 DIAGNOSIS — R001 Bradycardia, unspecified: Secondary | ICD-10-CM | POA: Diagnosis not present

## 2018-10-27 DIAGNOSIS — N183 Chronic kidney disease, stage 3 unspecified: Secondary | ICD-10-CM | POA: Diagnosis present

## 2018-10-27 DIAGNOSIS — R7989 Other specified abnormal findings of blood chemistry: Secondary | ICD-10-CM | POA: Diagnosis not present

## 2018-10-27 DIAGNOSIS — Z7901 Long term (current) use of anticoagulants: Secondary | ICD-10-CM | POA: Diagnosis not present

## 2018-10-27 DIAGNOSIS — I82409 Acute embolism and thrombosis of unspecified deep veins of unspecified lower extremity: Secondary | ICD-10-CM

## 2018-10-27 DIAGNOSIS — I248 Other forms of acute ischemic heart disease: Secondary | ICD-10-CM | POA: Diagnosis not present

## 2018-10-27 DIAGNOSIS — C799 Secondary malignant neoplasm of unspecified site: Secondary | ICD-10-CM | POA: Diagnosis present

## 2018-10-27 DIAGNOSIS — R778 Other specified abnormalities of plasma proteins: Secondary | ICD-10-CM

## 2018-10-27 DIAGNOSIS — Z20828 Contact with and (suspected) exposure to other viral communicable diseases: Secondary | ICD-10-CM | POA: Diagnosis present

## 2018-10-27 DIAGNOSIS — I361 Nonrheumatic tricuspid (valve) insufficiency: Secondary | ICD-10-CM | POA: Diagnosis not present

## 2018-10-27 DIAGNOSIS — Z79891 Long term (current) use of opiate analgesic: Secondary | ICD-10-CM

## 2018-10-27 DIAGNOSIS — D62 Acute posthemorrhagic anemia: Secondary | ICD-10-CM | POA: Diagnosis present

## 2018-10-27 DIAGNOSIS — Z681 Body mass index (BMI) 19 or less, adult: Secondary | ICD-10-CM

## 2018-10-27 LAB — COMPREHENSIVE METABOLIC PANEL
ALT: 34 U/L (ref 0–44)
AST: 64 U/L — ABNORMAL HIGH (ref 15–41)
Albumin: 3 g/dL — ABNORMAL LOW (ref 3.5–5.0)
Alkaline Phosphatase: 203 U/L — ABNORMAL HIGH (ref 38–126)
Anion gap: 10 (ref 5–15)
BUN: 12 mg/dL (ref 8–23)
CO2: 22 mmol/L (ref 22–32)
Calcium: 8.6 mg/dL — ABNORMAL LOW (ref 8.9–10.3)
Chloride: 107 mmol/L (ref 98–111)
Creatinine, Ser: 1.47 mg/dL — ABNORMAL HIGH (ref 0.61–1.24)
GFR calc Af Amer: 53 mL/min — ABNORMAL LOW (ref >60)
GFR calc non Af Amer: 45 mL/min — ABNORMAL LOW (ref >60)
Glucose, Bld: 106 mg/dL — ABNORMAL HIGH (ref 70–99)
Potassium: 3.5 mmol/L (ref 3.5–5.1)
Sodium: 139 mmol/L (ref 135–145)
Total Bilirubin: 0.9 mg/dL (ref 0.3–1.2)
Total Protein: 5.7 g/dL — ABNORMAL LOW (ref 6.5–8.1)

## 2018-10-27 LAB — CBC
HCT: 26.6 % — ABNORMAL LOW (ref 39.0–52.0)
Hemoglobin: 8.3 g/dL — ABNORMAL LOW (ref 13.0–17.0)
MCH: 28.5 pg (ref 26.0–34.0)
MCHC: 31.2 g/dL (ref 30.0–36.0)
MCV: 91.4 fL (ref 80.0–100.0)
Platelets: 134 10*3/uL — ABNORMAL LOW (ref 150–400)
RBC: 2.91 MIL/uL — ABNORMAL LOW (ref 4.22–5.81)
RDW: 14.6 % (ref 11.5–15.5)
WBC: 7.4 10*3/uL (ref 4.0–10.5)
nRBC: 0 % (ref 0.0–0.2)

## 2018-10-27 LAB — BRAIN NATRIURETIC PEPTIDE: B Natriuretic Peptide: 39.6 pg/mL (ref 0.0–100.0)

## 2018-10-27 LAB — TROPONIN I (HIGH SENSITIVITY): Troponin I (High Sensitivity): 108 ng/L (ref <18)

## 2018-10-27 LAB — POC OCCULT BLOOD, ED: Fecal Occult Bld: POSITIVE — AB

## 2018-10-27 LAB — PREPARE RBC (CROSSMATCH)

## 2018-10-27 MED ORDER — SODIUM CHLORIDE 0.9 % IV SOLN
10.0000 mL/h | Freq: Once | INTRAVENOUS | Status: AC
  Administered 2018-10-28: 05:00:00 10 mL/h via INTRAVENOUS

## 2018-10-27 MED ORDER — SODIUM CHLORIDE 0.9 % IV BOLUS
1000.0000 mL | Freq: Once | INTRAVENOUS | Status: AC
  Administered 2018-10-27: 1000 mL via INTRAVENOUS

## 2018-10-27 NOTE — ED Notes (Signed)
Accessed port, unable to get blood return. Put in for IV consult.

## 2018-10-27 NOTE — ED Provider Notes (Addendum)
[place] DEPT Provider Note   CSN: [id number] Arrival date & time: 11/03/2018  1943     History   Chief Complaint Chief Complaint  Patient presents with  . Shortness of Breath    HPI Scott Harper is a 78 y.o. male.     HPI Patient presents the ED for evaluation of shortness of breath.  Patient has a history of metastatic colon cancer.  He is currently on hospice and no longer receiving any active treatment.  Patient was recently admitted to the hospital last month for pulmonary embolism and lobar pneumonia.  Patient states this evening he suddenly started to feel short of breath.  He also felt chilled and possibly feverish.  He denies any trouble with chest pain.  He denies any abdominal pain.  No vomiting or diarrhea.  Patient was given supplemental oxygen by EMS and that has helped.   Past Medical History:  Diagnosis Date  . Cancer [facility])    colon     Patient Active Problem List   Diagnosis Date Noted  . Lobar pneumonia ([place])   . Metastatic cancer ([place])   . Hospice care patient   . CKD (chronic kidney disease), stage III   . Hypokalemia   . CAP (community acquired pneumonia) 09/26/2018  . Acute pulmonary embolism ([place]) 09/26/2018  . ARF (acute renal failure) ([place]) 09/26/2018    Past Surgical History:  Procedure Laterality Date  . COLON SURGERY  2018  . TONSILLECTOMY          Home Medications    Prior to Admission medications   Medication Sig Start Date End Date Taking? Authorizing Provider  apixaban (ELIQUIS) 5 MG TABS tablet Take 1 tablet (5 mg total) by mouth 2 (two) times daily. 10/05/18  Yes Florencia Reasons, MD  budesonide-formoterol [facility]) 80-4.5 MCG/ACT inhaler Inhale 2 puffs into the lungs 2 (two) times daily. 11/22/17  Yes [provider]  dexamethasone (DECADRON) 4 MG tablet Take 4 mg by mouth See admin instructions. Take 2 days after chemo 11/22/17  Yes [provider]  gabapentin (NEURONTIN) 100 MG  capsule Take 100 mg by mouth 3 (three) times daily. 10/14/18  Yes [provider]  morphine (MS CONTIN) 100 MG 12 hr tablet Take 100 mg by mouth every 12 (twelve) hours.   Yes [provider]  omeprazole (PRILOSEC) 20 MG capsule Take 20 mg by mouth daily. 02/14/18 02/14/19 Yes [provider]  oxyCODONE (OXY IR/ROXICODONE) 5 MG immediate release tablet Take 5 mg by mouth every 4 (four) hours as needed for pain. 03/21/18  Yes [provider]  potassium chloride 20 MEQ TBCR Take 20 mEq by mouth 2 (two) times daily. 05/28/17  Yes Davonna Belling, MD  potassium chloride SA (KLOR-CON M20) 20 MEQ tablet Take 20 mEq by mouth daily. 06/18/18  Yes [provider]  prochlorperazine (COMPAZINE) 10 MG tablet Take 10 mg by mouth every 6 (six) hours as needed for nausea/vomiting. 11/22/17 11/22/18 Yes [provider]  promethazine (PHENERGAN) 25 MG tablet Take 25 mg by mouth every 6 (six) hours as needed for nausea/vomiting. 11/22/17  Yes [provider]  mirtazapine (REMERON) 30 MG tablet Take 30 mg by mouth at bedtime.    [provider]    Family History Family History  Family history unknown: Yes    Social History Social History   Tobacco Use  . Smoking status: Current Every Day Smoker    Types: Cigarettes  . Smokeless tobacco:  Never Used  Substance Use Topics  . Alcohol use: Yes    Comment: ONCE IN A WHILE  . Drug use: Yes    Types: Oxycodone, Morphine    Comment: PRESCRIBED MEDS     Allergies   Patient has no known allergies.   Review of Systems Review of Systems  All other systems reviewed and are negative.    Physical Exam Updated Vital Signs BP 90/63   Pulse 95   Temp 98.3 F (36.8 C) (Oral)   Resp (!) 21   SpO2 100%   Physical Exam Vitals signs and nursing note reviewed.  Constitutional:      Appearance: He is ill-appearing.  HENT:     Head: Normocephalic and atraumatic.     Right Ear: External ear  normal.     Left Ear: External ear normal.  Eyes:     General: No scleral icterus.       Right eye: No discharge.        Left eye: No discharge.     Conjunctiva/sclera: Conjunctivae normal.  Neck:     Musculoskeletal: Neck supple.     Trachea: No tracheal deviation.  Cardiovascular:     Rate and Rhythm: Normal rate and regular rhythm.  Pulmonary:     Effort: Pulmonary effort is normal. Tachypnea present.     Breath sounds: Normal breath sounds. No stridor. No decreased breath sounds, wheezing or rales.  Abdominal:     General: Bowel sounds are normal. There is no distension.     Palpations: Abdomen is soft.     Tenderness: There is no abdominal tenderness. There is no guarding or rebound.  Genitourinary:    Comments: Reddish brown stool Musculoskeletal:        General: No tenderness.  Skin:    General: Skin is warm and dry.     Findings: No rash.  Neurological:     Mental Status: He is alert.     Cranial Nerves: No cranial nerve deficit (no facial droop, extraocular movements intact, no slurred speech).     Sensory: No sensory deficit.     Motor: No abnormal muscle tone or seizure activity.     Coordination: Coordination normal.      ED Treatments / Results  Labs (all labs ordered are listed, but only abnormal results are displayed) Labs Reviewed  CBC - Abnormal; Notable for the following components:      Result Value   RBC 2.91 (*)    Hemoglobin 8.3 (*)    HCT 26.6 (*)    Platelets 134 (*)    All other components within normal limits  COMPREHENSIVE METABOLIC PANEL - Abnormal; Notable for the following components:   Glucose, Bld 106 (*)    Creatinine, Ser 1.47 (*)    Calcium 8.6 (*)    Total Protein 5.7 (*)    Albumin 3.0 (*)    AST 64 (*)    Alkaline Phosphatase 203 (*)    GFR calc non Af Amer 45 (*)    GFR calc Af Amer 53 (*)    All other components within normal limits  TROPONIN I (HIGH SENSITIVITY) - Abnormal; Notable for the following components:    Troponin I (High Sensitivity) 108 (*)    All other components within normal limits  BRAIN NATRIURETIC PEPTIDE  POC OCCULT BLOOD, ED  TYPE AND SCREEN  TROPONIN I (HIGH SENSITIVITY)    EKG EKG Interpretation  Date/Time:  Monday October 27 2018 20:22:19 EDT Ventricular Rate:  97 PR Interval:    QRS Duration: 88 QT Interval:  371 QTC Calculation: 472 R Axis:   19 Text Interpretation:  Sinus rhythm Anteroseptal infarct, age indeterminate No significant change since last tracing Confirmed by Dorie Rank [phone]) on 10/30/2018 8:40:16 PM   Radiology Dg Chest Port 1 View  Result Date: 11/17/2018 CLINICAL DATA:  Shortness of breath EXAM: PORTABLE CHEST 1 VIEW COMPARISON:  09/26/2018 FINDINGS: The heart size and mediastinal contours are within normal limits. Both lungs are clear. The visualized skeletal structures are unremarkable. There is a right chest wall power-injectable Port-A-Cath with tip in the lower SVC via a right internal jugular vein approach. IMPRESSION: No active disease. Electronically Signed   By: Ulyses Jarred M.D.   On: 11/02/2018 21:07    Procedures .Critical Care Performed by: Dorie Rank, MD Authorized by: Dorie Rank, MD   Critical care provider statement:    Critical care time (minutes):  35   Critical care was time spent personally by me on the following activities:  Discussions with consultants, evaluation of patient's response to treatment, examination of patient, ordering and performing treatments and interventions, ordering and review of laboratory studies, ordering and review of radiographic studies, pulse oximetry, re-evaluation of patient's condition, obtaining history from patient or surrogate and review of old charts   (including critical care time)  Medications Ordered in ED Medications  sodium chloride 0.9 % bolus 1,000 mL (1,000 mLs Intravenous New Bag/Given 11/22/2018 2243)     Initial Impression / Assessment and Plan / ED Course  I have reviewed the  triage vital signs and the nursing notes.  Pertinent labs & imaging results that were available during my care of the patient were reviewed by me and considered in my medical decision making (see chart for details).  Clinical Course as of Oct 29 1130  Mon Oct 27, 2018  2229 Hemoglobin decreased from previous values   [JK]  2229 Chest x-ray without acute findings.   [id number] Patient's blood pressure noted to be low.  IV hydration ordered.  WIll type and screen   [JK]  2255 Troponin elevated.  Patient confirms no chest pain when asked further does admit to having some chest pressure.   [JK]  2318 Discussed with Cardiology, Dr Vickki Muff.  Suspect sx related to demand ischemia.   No indication for acute cardiac intervention.   [JK]    Clinical Course User Index [JK] Dorie Rank, MD     Patient presented to the ED with complaints of shortness of breath.  Patient has known history of metastatic cancer, currently on hospice.  Patient also has recent history of pulmonary embolism, on anticoagulants.  Patient has noticed some blood in his stool recently.  He does have a drop in his hemoglobin.  Patient's blood pressures are also on the low end.  I suspect there is a component of acute blood loss anemia associated with his symptoms.  Patient also has an elevated troponin.  No definite EKG changes.  This may be a component of demand ischemia associated with his GI bleeding.  I will consult with cardiology in the medical service.  Discussed code status with patient.  Confirms hospice and DNR.  Final Clinical Impressions(s) / ED Diagnoses   Final diagnoses:  Gastrointestinal hemorrhage, unspecified gastrointestinal hemorrhage type  Elevated troponin      Dorie Rank, MD 11/19/2018 [id number]    Dorie Rank, MD 10/29/18 1132

## 2018-10-27 NOTE — H&P (Signed)
History and Physical    Scott Harper [id number] DOB: 1940/09/09 DOA: 11/05/2018  PCP: Patient, No Pcp Per Patient coming from: Home  Chief Complaint: Shortness of breath  HPI: Scott Harper is a 78 y.o. 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  Patient was admitted to the hospital last month for acute bilateral PE and possible pneumonia versus pulmonary infarct.  He was started on Eliquis.  Patient reports having shortness of breath for the past few weeks.  Denies cough.  States he has noticed blood in his stool since he started taking Eliquis.  He has not vomited any blood.  He has also been having epigastric abdominal pain.  Denies history of prior GI bleed.  States he stopped taking Eliquis 4 to 5 days ago as he was noticing blood in his stool.  No additional history could be obtained from him.  ED Course: Not hypoxic, placed on nonrebreather by EMS for comfort.  Blood pressure soft.  Slightly tachypneic.  Hemoglobin 8.3, was 11.3 on 09/28/2018.  BNP normal.  High-sensitivity troponin 108.  EKG not suggestive of ACS.  FOBT positive.  SARS-CoV-2 test pending. Chest x-ray showing no active disease. Patient received 1 L normal saline bolus.  1 unit PRBCs ordered.  Review of Systems:  All systems reviewed and apart from history of presenting illness, are negative.  Past Medical History:  Diagnosis Date   Cancer [facility])    colon     Past Surgical History:  Procedure Laterality Date   COLON SURGERY  2018   TONSILLECTOMY       reports that he has been smoking cigarettes. He has never used smokeless tobacco. He reports current alcohol use. He reports current drug use. Drugs: Oxycodone and Morphine.  No Known Allergies  Family History  Family history unknown: Yes    Prior to  Admission medications   Medication Sig Start Date End Date Taking? Authorizing Provider  apixaban (ELIQUIS) 5 MG TABS tablet Take 1 tablet (5 mg total) by mouth 2 (two) times daily. 10/05/18  Yes Florencia Reasons, MD  budesonide-formoterol [facility]) 80-4.5 MCG/ACT inhaler Inhale 2 puffs into the lungs 2 (two) times daily. 11/22/17  Yes [provider]  dexamethasone (DECADRON) 4 MG tablet Take 4 mg by mouth See admin instructions. Take 2 days after chemo 11/22/17  Yes [provider]  gabapentin (NEURONTIN) 100 MG capsule Take 100 mg by mouth 3 (three) times daily. 10/14/18  Yes [provider]  morphine (MS CONTIN) 100 MG 12 hr tablet Take 100 mg by mouth every 12 (twelve) hours.   Yes [provider]  omeprazole (PRILOSEC) 20 MG capsule Take 20 mg by mouth daily. 02/14/18 02/14/19 Yes [provider]  oxyCODONE (OXY IR/ROXICODONE) 5 MG immediate release tablet Take 5 mg by mouth every 4 (four) hours as needed for pain. 03/21/18  Yes [provider]  potassium chloride 20 MEQ TBCR Take 20 mEq by mouth 2 (two) times daily. 05/28/17  Yes Davonna Belling, MD  potassium chloride SA (KLOR-CON M20) 20 MEQ tablet Take 20 mEq by mouth daily. 06/18/18  Yes [provider]  prochlorperazine (COMPAZINE) 10 MG tablet Take 10 mg by mouth every 6 (six) hours as needed for nausea/vomiting. 11/22/17 11/22/18 Yes [provider]  promethazine (PHENERGAN) 25 MG tablet Take 25 mg by mouth every  6 (six) hours as needed for nausea/vomiting. 11/22/17  Yes [provider]  mirtazapine (REMERON) 30 MG tablet Take 30 mg by mouth at bedtime.    [provider]    Physical Exam: Vitals:   10/28/18 0419 10/28/18 0450 10/28/18 0500 10/28/18 0600  BP: 104/62 108/68 111/69 135/76  Pulse: 97 85 91 87  Resp: 16 20 19  (!) 23  Temp: 98.4 F (36.9 C) 98.6 F (37 C)    TempSrc: Oral Oral    SpO2: 96% 99% 100% 100%  Weight:      Height:         Physical Exam  Constitutional: He is oriented to person, place, and time. He appears well-developed and well-nourished. No distress.  HENT:  Head: Normocephalic.  Eyes: Right eye exhibits no discharge. Left eye exhibits no discharge.  Neck: Neck supple.  Cardiovascular: Normal rate, regular rhythm and intact distal pulses.  Pulmonary/Chest: Effort normal and breath sounds normal. He has no wheezes. He has no rales.  On 2 L supplemental oxygen  Abdominal: Soft. Bowel sounds are normal. He exhibits no distension. There is abdominal tenderness. There is guarding. There is no rebound.  Epigastrium tender to palpation with guarding  Musculoskeletal:        General: No edema.  Neurological: He is alert and oriented to person, place, and time.  Skin: Skin is warm and dry. He is not diaphoretic.     Labs on Admission: I have personally reviewed following labs and imaging studies  CBC: Recent Labs  Lab 11/06/2018 2012  WBC 7.4  HGB 8.3*  HCT 26.6*  MCV 91.4  PLT [id number]*   Basic Metabolic Panel: Recent Labs  Lab 11/03/2018 2012  NA 139  K 3.5  CL 107  CO2 22  GLUCOSE 106*  BUN 12  CREATININE 1.47*  CALCIUM 8.6*   GFR: Estimated Creatinine Clearance: 32.1 mL/min (A) (by C-G formula based on SCr of 1.47 mg/dL (H)). Liver Function Tests: Recent Labs  Lab 11/18/2018 2012  AST 64*  ALT 34  ALKPHOS 203*  BILITOT 0.9  PROT 5.7*  ALBUMIN 3.0*   No results for input(s): LIPASE, AMYLASE in the last 168 hours. No results for input(s): AMMONIA in the last 168 hours. Coagulation Profile: No results for input(s): INR, PROTIME in the last 168 hours. Cardiac Enzymes: No results for input(s): CKTOTAL, CKMB, CKMBINDEX, TROPONINI in the last 168 hours. BNP (last 3 results) No results for input(s): PROBNP in the last 8760 hours. HbA1C: No results for input(s): HGBA1C in the last 72 hours. CBG: No results for input(s): GLUCAP in the last 168 hours. Lipid Profile: No results for  input(s): CHOL, HDL, LDLCALC, TRIG, CHOLHDL, LDLDIRECT in the last 72 hours. Thyroid Function Tests: No results for input(s): TSH, T4TOTAL, FREET4, T3FREE, THYROIDAB in the last 72 hours. Anemia Panel: No results for input(s): VITAMINB12, FOLATE, FERRITIN, TIBC, IRON, RETICCTPCT in the last 72 hours. Urine analysis:    Component Value Date/Time   COLORURINE YELLOW 09/26/2018 1738   APPEARANCEUR CLEAR 09/26/2018 1738   LABSPEC 1.018 09/26/2018 1738   PHURINE 5.0 09/26/2018 1738   GLUCOSEU NEGATIVE 09/26/2018 1738   HGBUR NEGATIVE 09/26/2018 1738   BILIRUBINUR NEGATIVE 09/26/2018 1738   KETONESUR 5 (A) 09/26/2018 1738   PROTEINUR 100 (A) 09/26/2018 1738   NITRITE NEGATIVE 09/26/2018 1738   LEUKOCYTESUR NEGATIVE 09/26/2018 1738    Radiological Exams on Admission: Ct Angio Chest Pe W Or Wo Contrast  Result Date: 10/28/2018 CLINICAL DATA:  Shortness of breath and gastrointestinal bleed. EXAM: CT ANGIOGRAPHY CHEST CT ABDOMEN AND PELVIS WITH CONTRAST TECHNIQUE: Multidetector CT imaging of the chest was performed using the standard protocol during bolus administration of intravenous contrast. Multiplanar CT image reconstructions and MIPs were obtained to evaluate the vascular anatomy. Multidetector CT imaging of the abdomen and pelvis was performed using the standard protocol during bolus administration of intravenous contrast. CONTRAST:  91mL OMNIPAQUE IOHEXOL 350 MG/ML SOLN COMPARISON:  CTA chest 09/26/2018 FINDINGS: CTA CHEST FINDINGS Cardiovascular: Contrast injection is sufficient to demonstrate satisfactory opacification of the pulmonary arteries to the segmental level.There are multiple bilateral pulmonary artery filling defects. There are new filling defects that are proximal to the previously demonstrated emboli in the middle and lower lobes. Emboli in the right upper lobe appear to be new. On the left, there are new filling defects in the upper lobe and increase in the clot burden in the  left lower lobe. The main pulmonary artery is within normal limits for size. There is CT evidence of right heart strain with an RV to LV ratio of 2.2 . there is mild aortic atherosclerosis there is a normal 3-vessel arch branching pattern. Heart size is mildly enlarged. No pericardial effusion. Mediastinum/Nodes: No mediastinal, hilar or axillary lymphadenopathy. The visualized thyroid and thoracic esophageal course are unremarkable. Lungs/Pleura: There are innumerable bilateral nodular opacities. There is fluid along the right major fissure. There is a wedge-shaped area of opacity in the lingula that likely indicates an infarct. There are multiple pleural-based nodules within both lungs. Musculoskeletal: No chest wall abnormality. No acute or significant osseous findings. Review of the MIP images confirms the above findings. CT ABDOMEN and PELVIS FINDINGS Hepatobiliary: Numerous hypoattenuating hepatic masses, the largest of which measures 3.6 cm. Normal gallbladder. Pancreas: Normal contours without ductal dilatation. No peripancreatic fluid collection. Spleen: Normal. Adrenals/Urinary Tract: --Adrenal glands: Normal. --Right kidney/ureter: No hydronephrosis or perinephric stranding. No nephrolithiasis. No obstructing ureteral stones. --Left kidney/ureter: Multiple renal cyst measuring up to 28 mm. --Urinary bladder: Unremarkable. Stomach/Bowel: --Stomach/Duodenum: No hiatal hernia or other gastric abnormality. Normal duodenal course and caliber. --Small bowel: No dilatation or inflammation. --Colon: There is a anastomosis at the transverse colon. No other focal colonic abnormality is visible. --Appendix: Not visualized. No right lower quadrant inflammation or free fluid. Vascular/Lymphatic: Atherosclerotic calcification is present within the non-aneurysmal abdominal aorta, without hemodynamically significant stenosis. No abdominal or pelvic lymphadenopathy. Reproductive: Mildly enlarged prostate Musculoskeletal.  No bony spinal canal stenosis or focal osseous abnormality. Other: None. IMPRESSION: 1. Bilateral pulmonary emboli with CT evidence of right heart strain (RV to LV ratio of 2.2). The emboli most likely represent a combination of new emboli and clot propagation from older emboli. 2. Innumerable bilateral pulmonary parenchymal and pleural based nodules, consistent with metastatic disease. 3. Wedge-shaped area of opacity in the lingula, likely an infarct. 4. Numerous liver metastases. 5. Aortic Atherosclerosis (ICD10-I70.0). Critical Value/emergent results were called by telephone at the time of interpretation on 10/28/2018 at 2:53 am to Dr. Shela Leff , who verbally acknowledged these results. Electronically Signed   By: Ulyses Jarred M.D.   On: 10/28/2018 02:53   Ct Abdomen Pelvis W Contrast  Result Date: 10/28/2018 CLINICAL DATA:  Shortness of breath and gastrointestinal bleed. EXAM: CT ANGIOGRAPHY CHEST CT ABDOMEN AND PELVIS WITH CONTRAST TECHNIQUE: Multidetector CT imaging of the chest was performed using the standard protocol during bolus administration of intravenous contrast. Multiplanar CT image reconstructions and MIPs were obtained to evaluate the vascular anatomy. Multidetector CT  imaging of the abdomen and pelvis was performed using the standard protocol during bolus administration of intravenous contrast. CONTRAST:  70mL OMNIPAQUE IOHEXOL 350 MG/ML SOLN COMPARISON:  CTA chest 09/26/2018 FINDINGS: CTA CHEST FINDINGS Cardiovascular: Contrast injection is sufficient to demonstrate satisfactory opacification of the pulmonary arteries to the segmental level.There are multiple bilateral pulmonary artery filling defects. There are new filling defects that are proximal to the previously demonstrated emboli in the middle and lower lobes. Emboli in the right upper lobe appear to be new. On the left, there are new filling defects in the upper lobe and increase in the clot burden in the left lower lobe. The  main pulmonary artery is within normal limits for size. There is CT evidence of right heart strain with an RV to LV ratio of 2.2 . there is mild aortic atherosclerosis there is a normal 3-vessel arch branching pattern. Heart size is mildly enlarged. No pericardial effusion. Mediastinum/Nodes: No mediastinal, hilar or axillary lymphadenopathy. The visualized thyroid and thoracic esophageal course are unremarkable. Lungs/Pleura: There are innumerable bilateral nodular opacities. There is fluid along the right major fissure. There is a wedge-shaped area of opacity in the lingula that likely indicates an infarct. There are multiple pleural-based nodules within both lungs. Musculoskeletal: No chest wall abnormality. No acute or significant osseous findings. Review of the MIP images confirms the above findings. CT ABDOMEN and PELVIS FINDINGS Hepatobiliary: Numerous hypoattenuating hepatic masses, the largest of which measures 3.6 cm. Normal gallbladder. Pancreas: Normal contours without ductal dilatation. No peripancreatic fluid collection. Spleen: Normal. Adrenals/Urinary Tract: --Adrenal glands: Normal. --Right kidney/ureter: No hydronephrosis or perinephric stranding. No nephrolithiasis. No obstructing ureteral stones. --Left kidney/ureter: Multiple renal cyst measuring up to 28 mm. --Urinary bladder: Unremarkable. Stomach/Bowel: --Stomach/Duodenum: No hiatal hernia or other gastric abnormality. Normal duodenal course and caliber. --Small bowel: No dilatation or inflammation. --Colon: There is a anastomosis at the transverse colon. No other focal colonic abnormality is visible. --Appendix: Not visualized. No right lower quadrant inflammation or free fluid. Vascular/Lymphatic: Atherosclerotic calcification is present within the non-aneurysmal abdominal aorta, without hemodynamically significant stenosis. No abdominal or pelvic lymphadenopathy. Reproductive: Mildly enlarged prostate Musculoskeletal. No bony spinal canal  stenosis or focal osseous abnormality. Other: None. IMPRESSION: 1. Bilateral pulmonary emboli with CT evidence of right heart strain (RV to LV ratio of 2.2). The emboli most likely represent a combination of new emboli and clot propagation from older emboli. 2. Innumerable bilateral pulmonary parenchymal and pleural based nodules, consistent with metastatic disease. 3. Wedge-shaped area of opacity in the lingula, likely an infarct. 4. Numerous liver metastases. 5. Aortic Atherosclerosis (ICD10-I70.0). Critical Value/emergent results were called by telephone at the time of interpretation on 10/28/2018 at 2:53 am to Dr. Shela Leff , who verbally acknowledged these results. Electronically Signed   By: Ulyses Jarred M.D.   On: 10/28/2018 02:53   Dg Chest Port 1 View  Result Date: 11/06/2018 CLINICAL DATA:  Shortness of breath EXAM: PORTABLE CHEST 1 VIEW COMPARISON:  09/26/2018 FINDINGS: The heart size and mediastinal contours are within normal limits. Both lungs are clear. The visualized skeletal structures are unremarkable. There is a right chest wall power-injectable Port-A-Cath with tip in the lower SVC via a right internal jugular vein approach. IMPRESSION: No active disease. Electronically Signed   By: Ulyses Jarred M.D.   On: 10/26/2018 21:07    EKG: Independently reviewed.  Sinus rhythm.  No significant change since prior tracing.  Assessment/Plan Principal Problem:   Pulmonary embolism (HCC) Active Problems:  GI bleed   Acute blood loss anemia   Subacute bilateral PE with evidence of worsening right heart strain on CT Patient was admitted to the hospital a month ago for acute bilateral PE in the setting of metastatic cancer.  He has failed Eliquis as repeat CT angiogram showing bilateral PE and evidence of worsening right heart strain (RV to LV ratio of 2.2).  There is evidence of new emboli and clot propagation from older emboli.  Wedge-shaped area of opacity in the lingula, likely an  infarct.  High-sensitivity troponin elevated in the setting of right heart strain (108 > 197 > 277).  EKG without acute ischemic changes.  Difficult situation as patient has also been having intermittent hematochezia since he was started on Eliquis and hemoglobin with 3 g drop compared to labs done during hospitalization a month ago.  FOBT positive and stool mixed with blood on exam done by ED provider.  Blood pressure initially soft, now improved with IV fluid bolus. Discussed the case with Dr. Ronnette Juniper from PCCM and Dr. Laurence Ferrari with IR.  Patient is not a candidate for thrombolytics given active GI bleed.  The only option at this point is trying unfractionated heparin at a low dose and monitoring closely for worsening GI bleed.  IVC filter may be a possible option. - I have had lengthy goals of care discussions with the patient and his daughter twice overnight.  They confirmed his DNR status but do not want comfort care measures.  Patient wants to be treated for his PE.  I have discussed the risks of anticoagulation given active GI bleed and patient wishes to proceed with heparin.  Daughter is in agreement.  -IV heparin infusion, start conservatively at a low dose and monitor closely for worsening GI bleed -Echocardiogram -Lower extremity Dopplers -Palliative care consult -Continuous pulse ox, supplemental oxygen PRN -Continue to trend troponin  Acute blood loss anemia secondary to acute GI bleed 3 g drop in hemoglobin. FOBT positive and stool mixed with blood on exam done by ED provider.  Blood pressure initially soft, now improved with IV fluid bolus. -Type and screen -1 units PRBCs -Serial H&H -IV PPI bolus and infusion -IV fluid hydration -Consult GI in a.m. -Keep n.p.o. -Palliative care consult as mentioned above  DVT prophylaxis: Heparin Code Status: DNR.  Discussed with patient and his daughter. Family Communication: Patient's daughter updated over the phone. Disposition Plan:  Anticipate discharge after clinical improvement. Admission status: It is my clinical opinion that admission to INPATIENT is reasonable and necessary in this 78 y.o. male  presenting with subacute bilateral PE with evidence of right heart strain and acute blood loss anemia secondary to GI bleed.  Started on heparin for anticoagulation.  Please read assessment and plan above.  Palliative care consult needed.  Very high risk of decompensation.  Given the aforementioned, the predictability of an adverse outcome is felt to be significant. I expect that the patient will require at least 2 midnights in the hospital to treat this condition.   The medical decision making on this patient was of high complexity and the patient is at high risk for clinical deterioration, therefore this is a level 3 visit.  Shela Leff MD [facility] Pager [phone]  If 7PM-7AM, please contact night-coverage [email or web address] Password [email or web address]  10/28/2018, 7:04 AM

## 2018-10-27 NOTE — ED Notes (Signed)
CRITICAL VALUE STICKER  CRITICAL VALUE: troponin 108  RECEIVER : Onofre Gains, RN  DATE & TIME NOTIFIED: 10/5 2240  MD NOTIFIED: Dr. Tomi Bamberger  TIME OF NOTIFICATION: 10/5 2241

## 2018-10-27 NOTE — ED Triage Notes (Signed)
Per EMS - Pt coming from home with CC of SOB. Friend called EMS bc pt claimed to be SOB, couldn't see, and diaphoretic. EMS states as soon as they put him on NRB all symptoms went away. Pt friend and pt daughter claim that pt is on hospice care, but could not provide any paperwork or proof of such. Family attempting to find paperwork and bring it here to [facility]. Pt currently on NRB @12L  98-100%.  130 76 80 HR 20 R 98% RA  97.2  Port in rt chest

## 2018-10-28 ENCOUNTER — Inpatient Hospital Stay (HOSPITAL_COMMUNITY): Payer: Medicare Other

## 2018-10-28 ENCOUNTER — Other Ambulatory Visit: Payer: Self-pay

## 2018-10-28 ENCOUNTER — Encounter (HOSPITAL_COMMUNITY): Payer: Self-pay

## 2018-10-28 DIAGNOSIS — Z681 Body mass index (BMI) 19 or less, adult: Secondary | ICD-10-CM | POA: Diagnosis not present

## 2018-10-28 DIAGNOSIS — I361 Nonrheumatic tricuspid (valve) insufficiency: Secondary | ICD-10-CM | POA: Diagnosis not present

## 2018-10-28 DIAGNOSIS — Z20828 Contact with and (suspected) exposure to other viral communicable diseases: Secondary | ICD-10-CM | POA: Diagnosis not present

## 2018-10-28 DIAGNOSIS — C787 Secondary malignant neoplasm of liver and intrahepatic bile duct: Secondary | ICD-10-CM | POA: Diagnosis present

## 2018-10-28 DIAGNOSIS — I2609 Other pulmonary embolism with acute cor pulmonale: Secondary | ICD-10-CM | POA: Diagnosis not present

## 2018-10-28 DIAGNOSIS — I959 Hypotension, unspecified: Secondary | ICD-10-CM | POA: Diagnosis not present

## 2018-10-28 DIAGNOSIS — D500 Iron deficiency anemia secondary to blood loss (chronic): Secondary | ICD-10-CM | POA: Diagnosis not present

## 2018-10-28 DIAGNOSIS — K5791 Diverticulosis of intestine, part unspecified, without perforation or abscess with bleeding: Secondary | ICD-10-CM | POA: Diagnosis not present

## 2018-10-28 DIAGNOSIS — I248 Other forms of acute ischemic heart disease: Secondary | ICD-10-CM | POA: Diagnosis present

## 2018-10-28 DIAGNOSIS — Z9049 Acquired absence of other specified parts of digestive tract: Secondary | ICD-10-CM | POA: Diagnosis not present

## 2018-10-28 DIAGNOSIS — Z79891 Long term (current) use of opiate analgesic: Secondary | ICD-10-CM | POA: Diagnosis not present

## 2018-10-28 DIAGNOSIS — K921 Melena: Secondary | ICD-10-CM | POA: Diagnosis not present

## 2018-10-28 DIAGNOSIS — Z7901 Long term (current) use of anticoagulants: Secondary | ICD-10-CM | POA: Diagnosis not present

## 2018-10-28 DIAGNOSIS — I2782 Chronic pulmonary embolism: Secondary | ICD-10-CM | POA: Diagnosis present

## 2018-10-28 DIAGNOSIS — D62 Acute posthemorrhagic anemia: Secondary | ICD-10-CM | POA: Diagnosis present

## 2018-10-28 DIAGNOSIS — R001 Bradycardia, unspecified: Secondary | ICD-10-CM | POA: Diagnosis not present

## 2018-10-28 DIAGNOSIS — R0602 Shortness of breath: Secondary | ICD-10-CM | POA: Diagnosis present

## 2018-10-28 DIAGNOSIS — C801 Malignant (primary) neoplasm, unspecified: Secondary | ICD-10-CM | POA: Diagnosis not present

## 2018-10-28 DIAGNOSIS — R0902 Hypoxemia: Secondary | ICD-10-CM | POA: Diagnosis not present

## 2018-10-28 DIAGNOSIS — C189 Malignant neoplasm of colon, unspecified: Secondary | ICD-10-CM | POA: Diagnosis not present

## 2018-10-28 DIAGNOSIS — Z66 Do not resuscitate: Secondary | ICD-10-CM | POA: Diagnosis present

## 2018-10-28 DIAGNOSIS — F17210 Nicotine dependence, cigarettes, uncomplicated: Secondary | ICD-10-CM | POA: Diagnosis present

## 2018-10-28 DIAGNOSIS — I2699 Other pulmonary embolism without acute cor pulmonale: Secondary | ICD-10-CM | POA: Diagnosis present

## 2018-10-28 DIAGNOSIS — Z7189 Other specified counseling: Secondary | ICD-10-CM | POA: Diagnosis not present

## 2018-10-28 DIAGNOSIS — N183 Chronic kidney disease, stage 3 unspecified: Secondary | ICD-10-CM | POA: Diagnosis present

## 2018-10-28 DIAGNOSIS — Z79899 Other long term (current) drug therapy: Secondary | ICD-10-CM | POA: Diagnosis not present

## 2018-10-28 DIAGNOSIS — Z515 Encounter for palliative care: Secondary | ICD-10-CM | POA: Diagnosis not present

## 2018-10-28 DIAGNOSIS — R64 Cachexia: Secondary | ICD-10-CM | POA: Diagnosis present

## 2018-10-28 DIAGNOSIS — Z23 Encounter for immunization: Secondary | ICD-10-CM | POA: Diagnosis not present

## 2018-10-28 DIAGNOSIS — K922 Gastrointestinal hemorrhage, unspecified: Secondary | ICD-10-CM | POA: Diagnosis present

## 2018-10-28 DIAGNOSIS — I82412 Acute embolism and thrombosis of left femoral vein: Secondary | ICD-10-CM | POA: Diagnosis not present

## 2018-10-28 DIAGNOSIS — Z7951 Long term (current) use of inhaled steroids: Secondary | ICD-10-CM | POA: Diagnosis not present

## 2018-10-28 DIAGNOSIS — R933 Abnormal findings on diagnostic imaging of other parts of digestive tract: Secondary | ICD-10-CM | POA: Diagnosis not present

## 2018-10-28 LAB — MRSA PCR SCREENING: MRSA by PCR: NEGATIVE

## 2018-10-28 LAB — CBC
HCT: 27.8 % — ABNORMAL LOW (ref 39.0–52.0)
Hemoglobin: 8.9 g/dL — ABNORMAL LOW (ref 13.0–17.0)
MCH: 29 pg (ref 26.0–34.0)
MCHC: 32 g/dL (ref 30.0–36.0)
MCV: 90.6 fL (ref 80.0–100.0)
Platelets: 126 10*3/uL — ABNORMAL LOW (ref 150–400)
RBC: 3.07 MIL/uL — ABNORMAL LOW (ref 4.22–5.81)
RDW: 14.6 % (ref 11.5–15.5)
WBC: 7.6 10*3/uL (ref 4.0–10.5)
nRBC: 0 % (ref 0.0–0.2)

## 2018-10-28 LAB — TROPONIN I (HIGH SENSITIVITY)
Troponin I (High Sensitivity): 197 ng/L (ref <18)
Troponin I (High Sensitivity): 277 ng/L (ref <18)
Troponin I (High Sensitivity): 153 ng/L (ref <18)
Troponin I (High Sensitivity): 138 ng/L (ref <18)
Troponin I (High Sensitivity): 133 ng/L (ref <18)

## 2018-10-28 LAB — HEMOGLOBIN AND HEMATOCRIT, BLOOD
HCT: 28.2 % — ABNORMAL LOW (ref 39.0–52.0)
HCT: 30 % — ABNORMAL LOW (ref 39.0–52.0)
Hemoglobin: 9 g/dL — ABNORMAL LOW (ref 13.0–17.0)
Hemoglobin: 9.7 g/dL — ABNORMAL LOW (ref 13.0–17.0)

## 2018-10-28 LAB — APTT
aPTT: 39 seconds — ABNORMAL HIGH (ref 24–36)
aPTT: 154 seconds — ABNORMAL HIGH (ref 24–36)

## 2018-10-28 LAB — HEPARIN LEVEL (UNFRACTIONATED)
Heparin Unfractionated: 0.23 IU/mL — ABNORMAL LOW (ref 0.30–0.70)
Heparin Unfractionated: 0.38 IU/mL (ref 0.30–0.70)
Heparin Unfractionated: 0.67 IU/mL (ref 0.30–0.70)

## 2018-10-28 LAB — ECHOCARDIOGRAM COMPLETE
Height: 68 in
Weight: 1901.25 oz

## 2018-10-28 LAB — SARS CORONAVIRUS 2 (TAT 6-24 HRS): SARS Coronavirus 2: NEGATIVE

## 2018-10-28 LAB — ABO/RH: ABO/RH(D): A POS

## 2018-10-28 LAB — BRAIN NATRIURETIC PEPTIDE: B Natriuretic Peptide: 351.6 pg/mL — ABNORMAL HIGH (ref 0.0–100.0)

## 2018-10-28 MED ORDER — SODIUM CHLORIDE 0.9 % IV SOLN
INTRAVENOUS | Status: AC
  Administered 2018-10-28: 03:00:00 via INTRAVENOUS

## 2018-10-28 MED ORDER — SODIUM CHLORIDE 0.9 % IV SOLN
8.0000 mg/h | INTRAVENOUS | Status: DC
  Administered 2018-10-28 – 2018-10-29 (×4): 8 mg/h via INTRAVENOUS
  Filled 2018-10-28 (×5): qty 80

## 2018-10-28 MED ORDER — ACETAMINOPHEN 650 MG RE SUPP: 650.0000 mg | Freq: Four times a day (QID) | RECTAL | Status: DC | PRN

## 2018-10-28 MED ORDER — INFLUENZA VAC A&B SA ADJ QUAD 0.5 ML IM PRSY
0.5000 mL | PREFILLED_SYRINGE | INTRAMUSCULAR | Status: AC
  Administered 2018-10-30: 0.5 mL via INTRAMUSCULAR
  Filled 2018-10-28: qty 0.5

## 2018-10-28 MED ORDER — MOMETASONE FURO-FORMOTEROL FUM 100-5 MCG/ACT IN AERO
2.0000 | INHALATION_SPRAY | Freq: Two times a day (BID) | RESPIRATORY_TRACT | Status: DC
  Administered 2018-10-28 – 2018-10-31 (×8): 2 via RESPIRATORY_TRACT
  Filled 2018-10-28: qty 8.8

## 2018-10-28 MED ORDER — ORAL CARE MOUTH RINSE
15.0000 mL | Freq: Two times a day (BID) | OROMUCOSAL | Status: DC
  Administered 2018-10-28 – 2018-10-31 (×7): 15 mL via OROMUCOSAL

## 2018-10-28 MED ORDER — PANTOPRAZOLE SODIUM 40 MG IV SOLR: 40.0000 mg | Freq: Once | INTRAVENOUS | Status: DC

## 2018-10-28 MED ORDER — ACETAMINOPHEN 325 MG PO TABS
650.0000 mg | ORAL_TABLET | Freq: Four times a day (QID) | ORAL | Status: DC | PRN
  Administered 2018-10-28 – 2018-10-31 (×7): 650 mg via ORAL
  Filled 2018-10-28 (×7): qty 2

## 2018-10-28 MED ORDER — IOHEXOL 350 MG/ML SOLN
100.0000 mL | Freq: Once | INTRAVENOUS | Status: AC | PRN
  Administered 2018-10-28: 80 mL via INTRAVENOUS

## 2018-10-28 MED ORDER — SODIUM CHLORIDE 0.9 % IV BOLUS
500.0000 mL | Freq: Once | INTRAVENOUS | Status: AC
  Administered 2018-10-28: 500 mL via INTRAVENOUS

## 2018-10-28 MED ORDER — SODIUM CHLORIDE 0.9 % IV SOLN
80.0000 mg | Freq: Once | INTRAVENOUS | Status: AC
  Administered 2018-10-28: 80 mg via INTRAVENOUS
  Filled 2018-10-28: qty 80

## 2018-10-28 MED ORDER — HEPARIN (PORCINE) 25000 UT/250ML-% IV SOLN
1000.0000 [IU]/h | INTRAVENOUS | Status: AC
  Administered 2018-10-28: 1000 [IU]/h via INTRAVENOUS
  Filled 2018-10-28: qty 250

## 2018-10-28 MED ORDER — SODIUM CHLORIDE (PF) 0.9 % IJ SOLN
INTRAMUSCULAR | Status: AC
  Administered 2018-10-28: 03:00:00
  Filled 2018-10-28: qty 50

## 2018-10-28 MED ORDER — CHLORHEXIDINE GLUCONATE CLOTH 2 % EX PADS
6.0000 | MEDICATED_PAD | Freq: Every day | CUTANEOUS | Status: DC
  Administered 2018-10-28 – 2018-10-31 (×4): 6 via TOPICAL

## 2018-10-28 MED ORDER — HEPARIN (PORCINE) 25000 UT/250ML-% IV SOLN
850.0000 [IU]/h | INTRAVENOUS | Status: DC
  Administered 2018-10-29 – 2018-10-30 (×2): 800 [IU]/h via INTRAVENOUS
  Filled 2018-10-28 (×2): qty 250

## 2018-10-28 NOTE — Progress Notes (Signed)
PROGRESS NOTE    Scott Harper  [id number] DOB: 1940/12/23 DOA: 10/29/2018 PCP: Patient, No Pcp Per   Brief Narrative:   Scott Harper is a 78 y.o. 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  Patient was admitted to the hospital last month for acute bilateral PE and possible pneumonia versus pulmonary infarct.  He was started on Eliquis.  Patient reports having shortness of breath for the past few weeks.  Denies cough.  States he has noticed blood in his stool since he started taking Eliquis.  He has not vomited any blood.  He has also been having epigastric abdominal pain.  Denies history of prior GI bleed.  States he stopped taking Eliquis 4 to 5 days ago as he was noticing blood in his stool.  No additional history could be obtained from him.  ED Course: Not hypoxic, placed on nonrebreather by EMS for comfort.  Blood pressure soft.  Slightly tachypneic.  Hemoglobin 8.3, was 11.3 on 09/28/2018.  BNP normal.  High-sensitivity troponin 108.  EKG not suggestive of ACS.  FOBT positive.  SARS-CoV-2 test pending. Chest x-ray showing no active disease. Patient received 1 L normal saline bolus.  1 unit PRBCs ordered.    Assessment & Plan:   Principal Problem:   Pulmonary embolism (HCC) Active Problems:   GI bleed   Acute blood loss anemia   Subacute bilateral pulmonary embolism with evidence of worsening right heart strain Pulmonary infarct Elevated troponin, likely demand ischemia from right heart strain secondary to PE Recently discharged on 09/26/2018 for acute bilateral PE in the setting of metastatic cancer, and discharged on Eliquis.  He has failed Eliquis as repeat CT angiogram showing bilateral PE and evidence of worsening right heart strain (RV to LV ratio of 2.2).  There is evidence of  new emboli and clot propagation from older emboli.  Wedge-shaped area of opacity in the lingula, likely an infarct. High-sensitivity troponin elevated in the setting of right heart strain (108 > 197 > 277).  EKG without acute ischemic changes.  Difficult situation as patient has also been having intermittent hematochezia since he was started on Eliquis and hemoglobin with 3 g drop compared to labs done during hospitalization a month ago.  FOBT positive and stool mixed with blood on exam done by ED provider.  Case was discussed with Dr. Ronnette Juniper from PCCM and Dr. Laurence Ferrari with IR by admitting provider; and was deemed patient not a candidate for thrombolytics given active GI bleed.  The only option at this point is trying unfractionated heparin at a low dose and monitoring closely for worsening GI bleed.   --Continue IV heparin infusion --Echocardiogram pending --Lower extremity venous duplex pending, if positive may be a candidate for IVC filter --Palliative care consultation for further goals of care/medical decision making, family support --Continue to monitor H&H closely while on heparin drip given GI bleed --Continue to trend troponin  Acute blood loss anemia secondary to acute GI bleed Patient presenting with shortness of breath.  Hemoglobin on admission noted to be 8.3, which is a 3 g drop since 09/28/2018 with hemoglobin 11.3.  Recently started on Eliquis for PE as above.  FOBT positive and stool mixed with blood on exam during admission. --Etiology likely secondary to anticoagulation with Eliquis complicated by his history of metastatic colorectal cancer, unclear upper versus  lower at this point as he initially stated some of bright red blood followed by dark blood in his stool. --s/p 1u pRBC on 10/6 --Hgb 8.3-->8.9 --Continue to monitor H&H every 6 hours --PPI drip --Patient currently declines endoscopy, will hold off on GI consultation at this time --Will start clear liquid diet at this point  as he declines endoscopy. 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  Patient is followed by [facility], Dr. Estelle Grumbles.  Last seen in office on 07/18/2018 with repeat CT scan at that time showing new and a large liver lesions with recommendations of hospice. --Palliative care consult as above for extremely poor prognosis, likely needs to transition to more comfort measures at this point   DVT prophylaxis: SCDs, heparin drip Code Status: DNR Family Communication: Discussed with daughter this morning via telephone, updated on patient's ultimately grim prognosis with underlying metastatic colon cancer now with acute on chronic pulmonary embolism with right heart strain combined with GI bleeding.  Discussed with her that he may not survive this hospitalization and that may need to consider hospice/comfort measures. Disposition Plan: Continue inpatient, further depending on clinical course, ultimately poor/grim prognosis which was relayed to patient and daughter.   Consultants:   Case was discussed by admitting provider with interventional radiology and PCCM.  Procedures:   none  Antimicrobials:   none   Subjective: Patient examined at bedside, resting comfortably.  Nursing present.  Just finished 1 unit PRBC infusion.  Continues with mild shortness of breath, oxygen well on room air.  Discussed with patient extensively at bedside that he has 2 significant acute medical issues in which they are underlying treatments contradict each other, which makes this an extremely difficult situation.  Instructed patient that we are maintaining him on a heparin drip for his acute on chronic pulmonary embolism now with right heart strain and that he likely has an upper versus lower GI bleed.  This is also complicated by his history of metastatic colon cancer in which she is currently on  hospice care at home.  Relayed all this information to his daughter who will further discuss with patient at bedside regarding further treatment.  Currently patient declined any further intervention with endoscopy this morning.  Patient without any further complaints.  Denies headache, no fever/chills/night sweats, no chest pain, palpitations, no cough/congestion.  No acute events overnight per nursing staff.  Objective: Vitals:   10/28/18 0600 10/28/18 0717 10/28/18 0900 10/28/18 0920  BP: 135/76 103/62 115/69   Pulse: 87 75 78   Resp: (!) 23 19 15    Temp:  98 F (36.7 C)    TempSrc:  Oral    SpO2: 100% 100% 95% 96%  Weight:      Height:        Intake/Output Summary (Last 24 hours) at 10/28/2018 1044 Last data filed at 10/28/2018 0717 Gross per 24 hour  Intake 1173.8 ml  Output 300 ml  Net 873.8 ml   Filed Weights   10/28/18 0413  Weight: 53.9 kg    Examination:  General exam: Appears calm and comfortable, thin and cachectic in appearance Respiratory system: Clear to auscultation. Respiratory effort normal.  Oxygenating well on room air Cardiovascular system: S1 & S2 heard, RRR. No JVD, murmurs, rubs, gallops or clicks. No pedal edema. Gastrointestinal system: Abdomen is nondistended, soft and nontender. No organomegaly or masses felt. Normal bowel sounds heard.  Central nervous system: Alert and oriented. No focal neurological deficits. Extremities: Symmetric 5 x 5 power. Skin: No rashes, lesions or ulcers Psychiatry: Judgement and insight appear normal. Mood & affect appropriate.     Data Reviewed: I have personally reviewed following labs and imaging studies  CBC: Recent Labs  Lab 10/31/2018 2012 10/28/18 0953  WBC 7.4 7.6  HGB 8.3* 8.9*  HCT 26.6* 27.8*  MCV 91.4 90.6  PLT 134* [id number]*   Basic Metabolic Panel: Recent Labs  Lab 11/18/2018 2012  NA 139  K 3.5  CL 107  CO2 22  GLUCOSE 106*  BUN 12  CREATININE 1.47*  CALCIUM 8.6*   GFR: Estimated Creatinine  Clearance: 32.1 mL/min (A) (by C-G formula based on SCr of 1.47 mg/dL (H)). Liver Function Tests: Recent Labs  Lab 10/26/2018 2012  AST 64*  ALT 34  ALKPHOS 203*  BILITOT 0.9  PROT 5.7*  ALBUMIN 3.0*   No results for input(s): LIPASE, AMYLASE in the last 168 hours. No results for input(s): AMMONIA in the last 168 hours. Coagulation Profile: No results for input(s): INR, PROTIME in the last 168 hours. Cardiac Enzymes: No results for input(s): CKTOTAL, CKMB, CKMBINDEX, TROPONINI in the last 168 hours. BNP (last 3 results) No results for input(s): PROBNP in the last 8760 hours. HbA1C: No results for input(s): HGBA1C in the last 72 hours. CBG: No results for input(s): GLUCAP in the last 168 hours. Lipid Profile: No results for input(s): CHOL, HDL, LDLCALC, TRIG, CHOLHDL, LDLDIRECT in the last 72 hours. Thyroid Function Tests: No results for input(s): TSH, T4TOTAL, FREET4, T3FREE, THYROIDAB in the last 72 hours. Anemia Panel: No results for input(s): VITAMINB12, FOLATE, FERRITIN, TIBC, IRON, RETICCTPCT in the last 72 hours. Sepsis Labs: No results for input(s): PROCALCITON, LATICACIDVEN in the last 168 hours.  Recent Results (from the past 240 hour(s))  MRSA PCR Screening     Status: None   Collection Time: 10/28/18  3:39 AM   Specimen: Nasal Mucosa; Nasopharyngeal  Result Value Ref Range Status   MRSA by PCR NEGATIVE NEGATIVE Final    Comment:        The GeneXpert MRSA Assay (FDA approved for NASAL specimens only), is one component of a comprehensive MRSA colonization surveillance program. It is not intended to diagnose MRSA infection nor to guide or monitor treatment for MRSA infections. Performed at [facility], [place] [address], [place], [place] [address]          Radiology Studies: Ct Angio Chest Pe W Or Wo Contrast  Result Date: 10/28/2018 CLINICAL DATA:  Shortness of breath and gastrointestinal bleed. EXAM: CT ANGIOGRAPHY CHEST CT ABDOMEN  AND PELVIS WITH CONTRAST TECHNIQUE: Multidetector CT imaging of the chest was performed using the standard protocol during bolus administration of intravenous contrast. Multiplanar CT image reconstructions and MIPs were obtained to evaluate the vascular anatomy. Multidetector CT imaging of the abdomen and pelvis was performed using the standard protocol during bolus administration of intravenous contrast. CONTRAST:  34mL OMNIPAQUE IOHEXOL 350 MG/ML SOLN COMPARISON:  CTA chest 09/26/2018 FINDINGS: CTA CHEST FINDINGS Cardiovascular: Contrast injection is sufficient to demonstrate satisfactory opacification of the pulmonary arteries to the segmental level.There are multiple bilateral pulmonary artery filling defects. There are new filling defects that are proximal to the previously demonstrated emboli in the middle and lower lobes. Emboli in the right upper lobe appear to be new. On the left, there are new filling defects in the upper lobe and increase in the clot burden  in the left lower lobe. The main pulmonary artery is within normal limits for size. There is CT evidence of right heart strain with an RV to LV ratio of 2.2 . there is mild aortic atherosclerosis there is a normal 3-vessel arch branching pattern. Heart size is mildly enlarged. No pericardial effusion. Mediastinum/Nodes: No mediastinal, hilar or axillary lymphadenopathy. The visualized thyroid and thoracic esophageal course are unremarkable. Lungs/Pleura: There are innumerable bilateral nodular opacities. There is fluid along the right major fissure. There is a wedge-shaped area of opacity in the lingula that likely indicates an infarct. There are multiple pleural-based nodules within both lungs. Musculoskeletal: No chest wall abnormality. No acute or significant osseous findings. Review of the MIP images confirms the above findings. CT ABDOMEN and PELVIS FINDINGS Hepatobiliary: Numerous hypoattenuating hepatic masses, the largest of which measures 3.6  cm. Normal gallbladder. Pancreas: Normal contours without ductal dilatation. No peripancreatic fluid collection. Spleen: Normal. Adrenals/Urinary Tract: --Adrenal glands: Normal. --Right kidney/ureter: No hydronephrosis or perinephric stranding. No nephrolithiasis. No obstructing ureteral stones. --Left kidney/ureter: Multiple renal cyst measuring up to 28 mm. --Urinary bladder: Unremarkable. Stomach/Bowel: --Stomach/Duodenum: No hiatal hernia or other gastric abnormality. Normal duodenal course and caliber. --Small bowel: No dilatation or inflammation. --Colon: There is a anastomosis at the transverse colon. No other focal colonic abnormality is visible. --Appendix: Not visualized. No right lower quadrant inflammation or free fluid. Vascular/Lymphatic: Atherosclerotic calcification is present within the non-aneurysmal abdominal aorta, without hemodynamically significant stenosis. No abdominal or pelvic lymphadenopathy. Reproductive: Mildly enlarged prostate Musculoskeletal. No bony spinal canal stenosis or focal osseous abnormality. Other: None. IMPRESSION: 1. Bilateral pulmonary emboli with CT evidence of right heart strain (RV to LV ratio of 2.2). The emboli most likely represent a combination of new emboli and clot propagation from older emboli. 2. Innumerable bilateral pulmonary parenchymal and pleural based nodules, consistent with metastatic disease. 3. Wedge-shaped area of opacity in the lingula, likely an infarct. 4. Numerous liver metastases. 5. Aortic Atherosclerosis (ICD10-I70.0). Critical Value/emergent results were called by telephone at the time of interpretation on 10/28/2018 at 2:53 am to Dr. Shela Leff , who verbally acknowledged these results. Electronically Signed   By: Ulyses Jarred M.D.   On: 10/28/2018 02:53   Ct Abdomen Pelvis W Contrast  Result Date: 10/28/2018 CLINICAL DATA:  Shortness of breath and gastrointestinal bleed. EXAM: CT ANGIOGRAPHY CHEST CT ABDOMEN AND PELVIS WITH  CONTRAST TECHNIQUE: Multidetector CT imaging of the chest was performed using the standard protocol during bolus administration of intravenous contrast. Multiplanar CT image reconstructions and MIPs were obtained to evaluate the vascular anatomy. Multidetector CT imaging of the abdomen and pelvis was performed using the standard protocol during bolus administration of intravenous contrast. CONTRAST:  40mL OMNIPAQUE IOHEXOL 350 MG/ML SOLN COMPARISON:  CTA chest 09/26/2018 FINDINGS: CTA CHEST FINDINGS Cardiovascular: Contrast injection is sufficient to demonstrate satisfactory opacification of the pulmonary arteries to the segmental level.There are multiple bilateral pulmonary artery filling defects. There are new filling defects that are proximal to the previously demonstrated emboli in the middle and lower lobes. Emboli in the right upper lobe appear to be new. On the left, there are new filling defects in the upper lobe and increase in the clot burden in the left lower lobe. The main pulmonary artery is within normal limits for size. There is CT evidence of right heart strain with an RV to LV ratio of 2.2 . there is mild aortic atherosclerosis there is a normal 3-vessel arch branching pattern. Heart size is mildly enlarged.  No pericardial effusion. Mediastinum/Nodes: No mediastinal, hilar or axillary lymphadenopathy. The visualized thyroid and thoracic esophageal course are unremarkable. Lungs/Pleura: There are innumerable bilateral nodular opacities. There is fluid along the right major fissure. There is a wedge-shaped area of opacity in the lingula that likely indicates an infarct. There are multiple pleural-based nodules within both lungs. Musculoskeletal: No chest wall abnormality. No acute or significant osseous findings. Review of the MIP images confirms the above findings. CT ABDOMEN and PELVIS FINDINGS Hepatobiliary: Numerous hypoattenuating hepatic masses, the largest of which measures 3.6 cm. Normal  gallbladder. Pancreas: Normal contours without ductal dilatation. No peripancreatic fluid collection. Spleen: Normal. Adrenals/Urinary Tract: --Adrenal glands: Normal. --Right kidney/ureter: No hydronephrosis or perinephric stranding. No nephrolithiasis. No obstructing ureteral stones. --Left kidney/ureter: Multiple renal cyst measuring up to 28 mm. --Urinary bladder: Unremarkable. Stomach/Bowel: --Stomach/Duodenum: No hiatal hernia or other gastric abnormality. Normal duodenal course and caliber. --Small bowel: No dilatation or inflammation. --Colon: There is a anastomosis at the transverse colon. No other focal colonic abnormality is visible. --Appendix: Not visualized. No right lower quadrant inflammation or free fluid. Vascular/Lymphatic: Atherosclerotic calcification is present within the non-aneurysmal abdominal aorta, without hemodynamically significant stenosis. No abdominal or pelvic lymphadenopathy. Reproductive: Mildly enlarged prostate Musculoskeletal. No bony spinal canal stenosis or focal osseous abnormality. Other: None. IMPRESSION: 1. Bilateral pulmonary emboli with CT evidence of right heart strain (RV to LV ratio of 2.2). The emboli most likely represent a combination of new emboli and clot propagation from older emboli. 2. Innumerable bilateral pulmonary parenchymal and pleural based nodules, consistent with metastatic disease. 3. Wedge-shaped area of opacity in the lingula, likely an infarct. 4. Numerous liver metastases. 5. Aortic Atherosclerosis (ICD10-I70.0). Critical Value/emergent results were called by telephone at the time of interpretation on 10/28/2018 at 2:53 am to Dr. Shela Leff , who verbally acknowledged these results. Electronically Signed   By: Ulyses Jarred M.D.   On: 10/28/2018 02:53   Dg Chest Port 1 View  Result Date: 10/23/2018 CLINICAL DATA:  Shortness of breath EXAM: PORTABLE CHEST 1 VIEW COMPARISON:  09/26/2018 FINDINGS: The heart size and mediastinal contours are  within normal limits. Both lungs are clear. The visualized skeletal structures are unremarkable. There is a right chest wall power-injectable Port-A-Cath with tip in the lower SVC via a right internal jugular vein approach. IMPRESSION: No active disease. Electronically Signed   By: Ulyses Jarred M.D.   On: 11/11/2018 21:07        Scheduled Meds:  Chlorhexidine Gluconate Cloth  6 each Topical Daily   [START ON 10/29/2018] influenza vaccine adjuvanted  0.5 mL Intramuscular Tomorrow-1000   mouth rinse  15 mL Mouth Rinse BID   mometasone-formoterol  2 puff Inhalation BID   Continuous Infusions:  sodium chloride Stopped (10/28/18 0435)   heparin 1,000 Units/hr (10/28/18 0409)   pantoprozole (PROTONIX) infusion 8 mg/hr (10/28/18 0303)     LOS: 0 days    Critical Care Time Upon my evaluation, this patient had a high probability of imminent or life-threatening deterioration due to acute on chronic pulmonary embolism with right heart strain, acute GI bleed, which required my direct attention, intervention, and personal management.  I have personally provided 43 minutes of critical care time exclusive of my time spent on separately billable procedures.  Time includes review of laboratory data, radiology results, discussion with consultants, and monitoring for potential decompensation.       Scott Tarver J [place] DO [facility] Pager [phone]  If 7PM-7AM, please contact night-coverage [email or web address] Password [email or web address]  10/28/2018, 10:44 AM

## 2018-10-28 NOTE — Progress Notes (Signed)
ANTICOAGULATION CONSULT NOTE   Pharmacy Consult for Heparin Indication: pulmonary embolus  No Known Allergies  Patient Measurements: Height: 5\' 8"  (172.7 cm) Weight: 118 lb 13.3 oz (53.9 kg) IBW/kg (Calculated) : 68.4 Heparin Dosing Weight:   Vital Signs: Temp: 98.1 F (36.7 C) (10/06 1100) Temp Source: Oral (10/06 1100) BP: 110/67 (10/06 1000) Pulse Rate: 77 (10/06 1000)  Labs: Recent Labs    11/20/2018 2012 11/07/2018 2308 10/28/18 0344 10/28/18 0953 10/28/18 1330  HGB 8.3*  --   --  8.9*  --   HCT 26.6*  --   --  27.8*  --   PLT 134*  --   --  126*  --   APTT  --   --  39*  --  154*  HEPARINUNFRC  --   --  0.23*  --  0.67  CREATININE 1.47*  --   --   --   --   TROPONINIHS 108* 197* 277*  --   --     Estimated Creatinine Clearance: 32.1 mL/min (A) (by C-G formula based on SCr of 1.47 mg/dL (H)).   Medical History: Past Medical History:  Diagnosis Date  . Cancer [facility])    colon     Medications:  Medications Prior to Admission  Medication Sig Dispense Refill Last Dose  . apixaban (ELIQUIS) 5 MG TABS tablet Take 1 tablet (5 mg total) by mouth 2 (two) times daily. 60 tablet 0 11/11/2018 at Unknown time  . budesonide-formoterol (SYMBICORT) 80-4.5 MCG/ACT inhaler Inhale 2 puffs into the lungs 2 (two) times daily.   10/26/2018 at Unknown time  . dexamethasone (DECADRON) 4 MG tablet Take 4 mg by mouth See admin instructions. Take 2 days after chemo   Past Month at Unknown time  . gabapentin (NEURONTIN) 100 MG capsule Take 100 mg by mouth 3 (three) times daily.   10/26/2018 at Unknown time  . morphine (MS CONTIN) 100 MG 12 hr tablet Take 100 mg by mouth every 12 (twelve) hours.   11/07/2018 at Unknown time  . omeprazole (PRILOSEC) 20 MG capsule Take 20 mg by mouth daily.   10/26/2018 at Unknown time  . oxyCODONE (OXY IR/ROXICODONE) 5 MG immediate release tablet Take 5 mg by mouth every 4 (four) hours as needed for pain.   11/04/2018 at Unknown time  . potassium chloride 20 MEQ  TBCR Take 20 mEq by mouth 2 (two) times daily. 10 tablet 0 10/24/2018 at Unknown time  . potassium chloride SA (KLOR-CON M20) 20 MEQ tablet Take 20 mEq by mouth daily.   10/26/2018 at Unknown time  . prochlorperazine (COMPAZINE) 10 MG tablet Take 10 mg by mouth every 6 (six) hours as needed for nausea/vomiting.   10/26/2018 at Unknown time  . promethazine (PHENERGAN) 25 MG tablet Take 25 mg by mouth every 6 (six) hours as needed for nausea/vomiting.   10/26/2018 at Unknown time  . mirtazapine (REMERON) 30 MG tablet Take 30 mg by mouth at bedtime.      Scheduled:  . Chlorhexidine Gluconate Cloth  6 each Topical Daily  . [START ON 10/29/2018] influenza vaccine adjuvanted  0.5 mL Intramuscular Tomorrow-1000  . mouth rinse  15 mL Mouth Rinse BID  . mometasone-formoterol  2 puff Inhalation BID   Infusions:  . heparin 1,000 Units/hr (10/28/18 0409)  . pantoprozole (PROTONIX) infusion 8 mg/hr (10/28/18 0303)    Assessment: Patient on apixaban prior to admission for PE.  MD determined patient has failed apixaban and desires for heparin without bolus  per pharmacy to begin.  MD also would like reduce goal range for levels (0.3 -0.5 heparin level or 66-85 secs)  Baseline coags ordered. LD apixaban 10/5  10/28/2018 APTT 154 supratherapeutic HL 0.67 Hgb 8.9, Plts 126 RN said pt is still bleeding but no more than when he came in  Goal of Therapy:  (reduced goal ranges) Heparin level 0.3-0.5 units/ml  aPTT 66-85 seconds Monitor platelets by anticoagulation protocol: Yes   Plan:  Hold heparin x 1 hour then decrease Heparin drip to 800 units/hr Check PTT and Heparin level at [place] 10/28/2018, 2:20 PM Pager [phone]

## 2018-10-28 NOTE — Progress Notes (Signed)
  Echocardiogram 2D Echocardiogram has been performed.  Scott Harper 10/28/2018, 3:04 PM

## 2018-10-28 NOTE — ED Notes (Signed)
ED TO INPATIENT HANDOFF REPORT  Name/Age/Gender Scott Harper 78 y.o. male  Code Status    Code Status Orders  (From admission, onward)         Start     Ordered   10/28/18 0047  Do not attempt resuscitation (DNR)  Continuous    Question Answer Comment  In the event of cardiac or respiratory ARREST Do not call a "code blue"   In the event of cardiac or respiratory ARREST Do not perform Intubation, CPR, defibrillation or ACLS   In the event of cardiac or respiratory ARREST Use medication by any route, position, wound care, and other measures to relive pain and suffering. May use oxygen, suction and manual treatment of airway obstruction as needed for comfort.      10/28/18 0052        Code Status History    Date Active Date Inactive Code Status Order ID Comments User Context   09/27/2018 0138 09/28/2018 1901 DNR [id number]  Rise Patience, MD Inpatient   09/27/2018 0106 09/27/2018 0138 Full Code [id number]  Rise Patience, MD Inpatient   Advance Care Planning Activity      Home/SNF/Other Home  Chief Complaint Shortness of Breath  Level of Care/Admitting Diagnosis ED Disposition    ED Disposition Condition [place]: [place] [100102]  Level of Care: Stepdown [14]  Admit to SDU based on following criteria: Hemodynamic compromise or significant risk of instability:  Patient requiring short term acute titration and management of vasoactive drips, and invasive monitoring (i.e., CVP and Arterial line).  Covid Evaluation: Person Under Investigation (PUI)  Diagnosis: GI bleed [id number]  Admitting Physician: Shela Leff [id number]  Attending Physician: Shela Leff [id number]  Estimated length of stay: past midnight tomorrow  Certification:: I certify this patient will need inpatient services for at least 2 midnights  PT Class (Do Not Modify): Inpatient [101]  PT Acc Code (Do Not Modify): Private [1]        Medical History Past Medical History:  Diagnosis Date  . Cancer ([place])    colon     Allergies No Known Allergies  IV Location/Drains/Wounds Patient Lines/Drains/Airways Status   Active Line/Drains/Airways    Name:   Placement date:   Placement time:   Site:   Days:   Implanted Port Right Chest   -    -    Chest      Implanted [facility] 10/25/2018   11/06/2018    2140    -   1   Peripheral IV 10/28/18 Right Forearm   10/28/18    0237    Forearm   less than 1          Labs/Imaging Results for orders placed or performed during the hospital encounter of 11/07/2018 (from the past 48 hour(s))  CBC     Status: Abnormal   Collection Time: 10/23/2018  8:12 PM  Result Value Ref Range   WBC 7.4 4.0 - 10.5 K/uL   RBC 2.91 (L) 4.22 - 5.81 MIL/uL   Hemoglobin 8.3 (L) 13.0 - 17.0 g/dL   HCT 26.6 (L) 39.0 - 52.0 %   MCV 91.4 80.0 - 100.0 fL   MCH 28.5 26.0 - 34.0 pg   MCHC 31.2 30.0 - 36.0 g/dL   RDW 14.6 11.5 - 15.5 %   Platelets 134 (L) 150 - 400 K/uL   nRBC 0.0 0.0 - 0.2 %    Comment: Performed at [facility]  [place] [address], [place], [place] [address]  Comprehensive metabolic panel     Status: Abnormal   Collection Time: 10/29/2018  8:12 PM  Result Value Ref Range   Sodium 139 135 - 145 mmol/L   Potassium 3.5 3.5 - 5.1 mmol/L   Chloride 107 98 - 111 mmol/L   CO2 22 22 - 32 mmol/L   Glucose, Bld 106 (H) 70 - 99 mg/dL   BUN 12 8 - 23 mg/dL   Creatinine, Ser 1.47 (H) 0.61 - 1.24 mg/dL   Calcium 8.6 (L) 8.9 - 10.3 mg/dL   Total Protein 5.7 (L) 6.5 - 8.1 g/dL   Albumin 3.0 (L) 3.5 - 5.0 g/dL   AST 64 (H) 15 - 41 U/L   ALT 34 0 - 44 U/L   Alkaline Phosphatase 203 (H) 38 - 126 U/L   Total Bilirubin 0.9 0.3 - 1.2 mg/dL   GFR calc non Af Amer 45 (L) >60 mL/min   GFR calc Af Amer 53 (L) >60 mL/min   Anion gap 10 5 - 15    Comment: Performed at [facility], [place] [address] [place], [place] [address]  Brain natriuretic peptide     Status: None    Collection Time: 11/09/2018  8:12 PM  Result Value Ref Range   B Natriuretic Peptide 39.6 0.0 - 100.0 pg/mL    Comment: Performed at [facility], [place] [address], [place], [place] [address]  Troponin I (High Sensitivity)     Status: Abnormal   Collection Time: 10/24/2018  8:12 PM  Result Value Ref Range   Troponin I (High Sensitivity) 108 (HH) <18 ng/L    Comment: CRITICAL RESULT CALLED TO, READ BACK BY AND VERIFIED WITH: REBECCA GRAVES, RN @ 2239 ON 10/26/2018 C VARNER (NOTE) Elevated high sensitivity troponin I (hsTnI) values and significant  changes across serial measurements may suggest ACS but many other  chronic and acute conditions are known to elevate hsTnI results.  Refer to the Links section for chest pain algorithms and additional  guidance. Performed at [facility], [place] [address], [place], [place] [address]   Troponin I (High Sensitivity)     Status: Abnormal   Collection Time: 10/31/2018 11:08 PM  Result Value Ref Range   Troponin I (High Sensitivity) 197 (HH) <18 ng/L    Comment: CRITICAL VALUE NOTED.  VALUE IS CONSISTENT WITH PREVIOUSLY REPORTED AND CALLED VALUE. (NOTE) Elevated high sensitivity troponin I (hsTnI) values and significant  changes across serial measurements may suggest ACS but many other  chronic and acute conditions are known to elevate hsTnI results.  Refer to the Links section for chest pain algorithms and additional  guidance. Performed at [facility], [place] [address] [place], [place] [address]   Type and screen [place]     Status: None (Preliminary result)   Collection Time: 11/16/2018 11:08 PM  Result Value Ref Range   ABO/RH(D) A POS    Antibody Screen NEG    Sample Expiration 10/30/2018,2359    Unit Number [id number]    Blood Component Type RED CELLS,LR    Unit division 00    Status of Unit ALLOCATED    Transfusion Status OK TO TRANSFUSE    Crossmatch Result       Compatible Performed at [facility], [place] [address], [place],  [address]   Prepare RBC     Status: None   Collection Time:  11/12/2018 11:08 PM  Result Value Ref Range   Order Confirmation      ORDER PROCESSED BY BLOOD BANK Performed at [facility], [place] [address] [place], [place] [address]   ABO/Rh     Status: None (Preliminary result)   Collection Time: 11/03/2018 11:08 PM  Result Value Ref Range   ABO/RH(D)      A POS Performed at [facility], [place] [address], [place], [place] [address]   POC occult blood, ED     Status: Abnormal   Collection Time: 11/21/2018 11:31 PM  Result Value Ref Range   Fecal Occult Bld POSITIVE (A) NEGATIVE   Dg Chest Port 1 View  Result Date: 11/05/2018 CLINICAL DATA:  Shortness of breath EXAM: PORTABLE CHEST 1 VIEW COMPARISON:  09/26/2018 FINDINGS: The heart size and mediastinal contours are within normal limits. Both lungs are clear. The visualized skeletal structures are unremarkable. There is a right chest wall power-injectable Port-A-Cath with tip in the lower SVC via a right internal jugular vein approach. IMPRESSION: No active disease. Electronically Signed   By: Ulyses Jarred M.D.   On: 11/17/2018 21:07    Pending Labs Unresulted Labs (From admission, onward)    Start     Ordered   11/13/2018 2332  SARS CORONAVIRUS 2 (TAT 6-24 HRS) Nasopharyngeal Nasopharyngeal Swab  (Asymptomatic/Tier 2 Patients Labs)  Once,   STAT    Question Answer Comment  Is this test for diagnosis or screening Screening   Symptomatic for COVID-19 as defined by CDC No   Hospitalized for COVID-19 No   Admitted to ICU for COVID-19 No   Previously tested for COVID-19 Yes   Resident in a congregate (group) care setting No   Employed in healthcare setting No      10/25/2018 2331          Vitals/Pain Today's Vitals   11/08/2018 2330 10/28/18 0000 10/28/18 0200 10/28/18 0238  BP: 95/65 97/65 105/61 109/62   Pulse: 88 91 91 96  Resp: 17 17 (!) 21 18  Temp:      TempSrc:      SpO2: 99% 99% 100% 100%  PainSc:        Isolation Precautions No active isolations  Medications Medications  0.9 %  sodium chloride infusion (has no administration in time range)  mometasone-formoterol (DULERA) 100-5 MCG/ACT inhaler 2 puff (2 puffs Inhalation Not Given 10/28/18 0128)  acetaminophen (TYLENOL) tablet 650 mg (has no administration in time range)    Or  acetaminophen (TYLENOL) suppository 650 mg (has no administration in time range)  pantoprazole (PROTONIX) 80 mg in sodium chloride 0.9 % 100 mL IVPB (80 mg Intravenous New Bag/Given 10/28/18 0241)  pantoprazole (PROTONIX) 80 mg in sodium chloride 0.9 % 250 mL (0.32 mg/mL) infusion (has no administration in time range)  0.9 %  sodium chloride infusion ( Intravenous New Bag/Given 10/28/18 0239)  sodium chloride 0.9 % bolus 1,000 mL (0 mLs Intravenous Stopped 11/09/2018 2352)  sodium chloride 0.9 % bolus 500 mL (0 mLs Intravenous Stopped 10/28/18 0237)  iohexol (OMNIPAQUE) 350 MG/ML injection 100 mL (80 mLs Intravenous Contrast Given 10/28/18 0210)  sodium chloride (PF) 0.9 % injection (  Given by Other 10/28/18 0239)    Mobility walks

## 2018-10-28 NOTE — Progress Notes (Signed)
Attempted to call daughter multiple times at phone number listed in chart. Will inform patient that we cannot reach her.

## 2018-10-28 NOTE — Progress Notes (Signed)
ANTICOAGULATION CONSULT NOTE - Initial Consult  Pharmacy Consult for Heparin Indication: pulmonary embolus  No Known Allergies  Patient Measurements:   Heparin Dosing Weight:   Vital Signs: Temp: 98.2 F (36.8 C) (10/06 0336) Temp Source: Oral (10/06 0336) BP: 116/67 (10/06 0336) Pulse Rate: 86 (10/06 0336)  Labs: Recent Labs    11/21/2018 2012 11/04/2018 2308  HGB 8.3*  --   HCT 26.6*  --   PLT 134*  --   CREATININE 1.47*  --   TROPONINIHS 108* 197*    CrCl cannot be calculated (Unknown ideal weight.).   Medical History: Past Medical History:  Diagnosis Date  . Cancer [facility])    colon     Medications:  Medications Prior to Admission  Medication Sig Dispense Refill Last Dose  . apixaban (ELIQUIS) 5 MG TABS tablet Take 1 tablet (5 mg total) by mouth 2 (two) times daily. 60 tablet 0 10/25/2018 at Unknown time  . budesonide-formoterol (SYMBICORT) 80-4.5 MCG/ACT inhaler Inhale 2 puffs into the lungs 2 (two) times daily.   10/26/2018 at Unknown time  . dexamethasone (DECADRON) 4 MG tablet Take 4 mg by mouth See admin instructions. Take 2 days after chemo   Past Month at Unknown time  . gabapentin (NEURONTIN) 100 MG capsule Take 100 mg by mouth 3 (three) times daily.   10/26/2018 at Unknown time  . morphine (MS CONTIN) 100 MG 12 hr tablet Take 100 mg by mouth every 12 (twelve) hours.   11/21/2018 at Unknown time  . omeprazole (PRILOSEC) 20 MG capsule Take 20 mg by mouth daily.   10/26/2018 at Unknown time  . oxyCODONE (OXY IR/ROXICODONE) 5 MG immediate release tablet Take 5 mg by mouth every 4 (four) hours as needed for pain.   10/30/2018 at Unknown time  . potassium chloride 20 MEQ TBCR Take 20 mEq by mouth 2 (two) times daily. 10 tablet 0 11/15/2018 at Unknown time  . potassium chloride SA (KLOR-CON M20) 20 MEQ tablet Take 20 mEq by mouth daily.   10/26/2018 at Unknown time  . prochlorperazine (COMPAZINE) 10 MG tablet Take 10 mg by mouth every 6 (six) hours as needed for  nausea/vomiting.   10/26/2018 at Unknown time  . promethazine (PHENERGAN) 25 MG tablet Take 25 mg by mouth every 6 (six) hours as needed for nausea/vomiting.   10/26/2018 at Unknown time  . mirtazapine (REMERON) 30 MG tablet Take 30 mg by mouth at bedtime.      Scheduled:  . Chlorhexidine Gluconate Cloth  6 each Topical Daily  . mometasone-formoterol  2 puff Inhalation BID   Infusions:  . sodium chloride    . sodium chloride 125 mL/hr at 10/28/18 0239  . heparin    . pantoprozole (PROTONIX) infusion 8 mg/hr (10/28/18 0303)    Assessment: Patient on apixaban prior to admission for PE.  MD determined patient has failed apixaban and desires for heparin without bolus per pharmacy to begin.  MD also would like reduce goal range for levels (0.3 -0.5 heparin level or 66-85 secs)  Baseline coags ordered.  Goal of Therapy:  (reduced goal ranges) Heparin level 0.3-0.5 units/ml  aPTT 66-85 seconds Monitor platelets by anticoagulation protocol: Yes   Plan:  No heparin bolus Heparin drip at 1000 units/hr Check PTT and Heparin level at [address], [place] 10/28/2018,4:03 AM

## 2018-10-28 NOTE — Consult Note (Signed)
                                                                                 Consultation Note Date: 10/28/2018   Patient Name: Scott Harper  DOB: 06/19/1940  MRN: [id number]  Age / Sex: 78 y.o., male  PCP: Patient, No Pcp Per Referring Physician: [place], Eric J, DO  Reason for Consultation: Establishing goals of care  HPI/Patient Profile: 78 y.o. male  with past medical history of met colon cancer s/p right hemicolectomy, PE on Eliquis currently on hospice admitted on 10/29/2018 with SOB.  Found to have worsening right heart strain related to progression of subacute bilateral PE.  Course further complicated by GI bleed while on anticoagulation.  Palliative consulted for GOC.   Clinical Assessment and Goals of Care: I met today with Mr. Brickman.  He reports that his family is the most important thing to him.  He feels that his care team has been doing a good job explaining things to him and he has, "two problems going against each other, and neither one can be fixed."  I expressed concern that he is correct that this is not going to be something that is going to "get fixed."   We discussed clinical course as well as wishes moving forward thi hospitalization.  We discussed difference between a aggressive medical intervention path and a palliative, comfort focused care path.  Values and goals of care important to patient and family were attempted to be elicited.  He reports understanding that time is likely short and is anxious about continued GI bleeding.  Reports that he needs time to process everything going on and to talk more with his daughter about options.  Concept of Hospice and Palliative Care were discussed  Questions and concerns addressed.   PMT will continue to support holistically.  SUMMARY OF RECOMMENDATIONS   - Patient reports understanding his situation, but states he needs time to process and make decisions with his family.  I will follow-up tomorrow per his  request. - Attempted to call daughter.  No answer.  Will attempt follow-up tomorrow.   Code Status/Advance Care Planning:  DNR  Palliative Prophylaxis:   Aspiration, Delirium Protocol and Frequent Pain Assessment  Psycho-social/Spiritual:   Desire for further Chaplaincy support:Did not address today  Additional Recommendations: Caregiving  Support/Resources  Prognosis:   Poor with PE and GI bleed on anticoagulation  Discharge Planning: To Be Determined      Primary Diagnoses: Present on Admission: . GI bleed . Pulmonary embolism (HCC) . Acute blood loss anemia . Metastatic cancer (HCC) . 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 The following aspects are pertinent.  Past Medical History:  Diagnosis Date  . Cancer (HCC)    colon    Social History   Socioeconomic History  . Marital status: Widowed    Spouse name: Not on file  . Number of children: Not on file  . Years of education: Not on file  . Highest education level: Not on file  Occupational History  . Not on file  Social Needs  .   Financial resource strain: Somewhat hard  . Food insecurity    Worry: Sometimes true    Inability: Sometimes true  . Transportation needs    Medical: No    Non-medical: No  Tobacco Use  . Smoking status: Current Every Day Smoker    Types: Cigarettes  . Smokeless tobacco: Never Used  Substance and Sexual Activity  . Alcohol use: Yes    Comment: ONCE IN A WHILE  . Drug use: Yes    Types: Oxycodone, Morphine    Comment: PRESCRIBED MEDS  . Sexual activity: Not Currently  Lifestyle  . Physical activity    Days per week: 0 days    Minutes per session: 0 min  . Stress: To some extent  Relationships  . Social connections    Talks on phone: More than three times a week    Gets together: Once a week    Attends religious service: More than 4 times per year    Active member  of club or organization: No    Attends meetings of clubs or organizations: Never    Relationship status: Widowed  Other Topics Concern  . Not on file  Social History Narrative  . Not on file   Family History  Family history unknown: Yes   Scheduled Meds: . Chlorhexidine Gluconate Cloth  6 each Topical Daily  . [START ON 10/29/2018] influenza vaccine adjuvanted  0.5 mL Intramuscular Tomorrow-1000  . mouth rinse  15 mL Mouth Rinse BID  . mometasone-formoterol  2 puff Inhalation BID   Continuous Infusions: . heparin 800 Units/hr (10/28/18 1533)  . pantoprozole (PROTONIX) infusion 8 mg/hr (10/28/18 1438)   PRN Meds:.acetaminophen **OR** acetaminophen Medications Prior to Admission:  Prior to Admission medications   Medication Sig Start Date End Date Taking? Authorizing Provider  apixaban (ELIQUIS) 5 MG TABS tablet Take 1 tablet (5 mg total) by mouth 2 (two) times daily. 10/05/18  Yes Florencia Reasons, MD  budesonide-formoterol [facility]) 80-4.5 MCG/ACT inhaler Inhale 2 puffs into the lungs 2 (two) times daily. 11/22/17  Yes [provider]  dexamethasone (DECADRON) 4 MG tablet Take 4 mg by mouth See admin instructions. Take 2 days after chemo 11/22/17  Yes [provider]  gabapentin (NEURONTIN) 100 MG capsule Take 100 mg by mouth 3 (three) times daily. 10/14/18  Yes [provider]  morphine (MS CONTIN) 100 MG 12 hr tablet Take 100 mg by mouth every 12 (twelve) hours.   Yes [provider]  omeprazole (PRILOSEC) 20 MG capsule Take 20 mg by mouth daily. 02/14/18 02/14/19 Yes [provider]  oxyCODONE (OXY IR/ROXICODONE) 5 MG immediate release tablet Take 5 mg by mouth every 4 (four) hours as needed for pain. 03/21/18  Yes [provider]  potassium chloride 20 MEQ TBCR Take 20 mEq by mouth 2 (two) times daily. 05/28/17  Yes Davonna Belling, MD  potassium chloride SA (KLOR-CON M20) 20 MEQ tablet Take 20 mEq by mouth daily. 06/18/18  Yes [provider]  prochlorperazine (COMPAZINE) 10 MG tablet Take 10 mg by mouth every 6 (six) hours as needed for nausea/vomiting. 11/22/17 11/22/18 Yes [provider]  promethazine (PHENERGAN) 25 MG tablet Take 25 mg by mouth every 6 (six) hours as needed for nausea/vomiting. 11/22/17  Yes [provider]  mirtazapine (REMERON) 30 MG tablet Take 30 mg by mouth at bedtime.    [provider]   No Known Allergies Review of Systems  Constitutional: Positive for activity change and fatigue.  Gastrointestinal:  Positive for abdominal pain, blood in stool and nausea.  Neurological: Positive for weakness.  Psychiatric/Behavioral: Positive for sleep disturbance.   Physical Exam General: Alert, awake, in no acute distress. Chronically ill appearing. Heart: Regular rate and rhythm. No murmur appreciated. Lungs: Good air movement, clear Abdomen: Soft, globally tender, nondistended, positive bowel sounds.  Ext: No significant edema Skin: Warm and dry Neuro: Grossly intact, nonfocal.   Vital Signs: BP 112/60   Pulse 81   Temp 98.9 F (37.2 C) (Oral)   Resp (!) 23   Ht 5' 8" (1.727 m)   Wt 53.9 kg   SpO2 95%   BMI 18.07 kg/m  Pain Scale: 0-10   Pain Score: 0-No pain   SpO2: SpO2: 95 % O2 Device:SpO2: 95 % O2 Flow Rate: .O2 Flow Rate (L/min): 3 L/min  IO: Intake/output summary:   Intake/Output Summary (Last 24 hours) at 10/28/2018 2201 Last data filed at 10/28/2018 2116 Gross per 24 hour  Intake 1750.26 ml  Output 450 ml  Net 1300.26 ml    LBM: Last BM Date: 10/28/18 Baseline Weight: Weight: 53.9 kg Most recent weight: Weight: 53.9 kg     Palliative Assessment/Data:   Flowsheet Rows     Most Recent Value  Intake Tab  Referral Department  Hospitalist  Unit at Time of Referral  ICU  Palliative Care Primary Diagnosis  Cancer  Date Notified  10/28/18  Palliative Care Type  New Palliative care  Reason for referral  Clarify Goals of Care  Date of  Admission  11/16/2018  Date first seen by Palliative Care  10/28/18  # of days Palliative referral response time  0 Day(s)  # of days IP prior to Palliative referral  1  Clinical Assessment  Palliative Performance Scale Score  40%  Psychosocial & Spiritual Assessment  Palliative Care Outcomes  Patient/Family meeting held?  Yes  Who was at the meeting?  Patient      Time In: 1645 Time Out: 1745 Time Total: 60 Greater than 50%  of this time was spent counseling and coordinating care related to the above assessment and plan.  Signed by: Gene Freeman, MD   Please contact Palliative Medicine Team phone at [phone] for questions and concerns.  For individual provider: See Amion             

## 2018-10-29 ENCOUNTER — Inpatient Hospital Stay (HOSPITAL_COMMUNITY): Payer: Medicare Other

## 2018-10-29 ENCOUNTER — Encounter (HOSPITAL_COMMUNITY): Payer: Self-pay | Admitting: Gastroenterology

## 2018-10-29 DIAGNOSIS — I82412 Acute embolism and thrombosis of left femoral vein: Secondary | ICD-10-CM

## 2018-10-29 DIAGNOSIS — I2699 Other pulmonary embolism without acute cor pulmonale: Secondary | ICD-10-CM

## 2018-10-29 DIAGNOSIS — K922 Gastrointestinal hemorrhage, unspecified: Secondary | ICD-10-CM | POA: Diagnosis present

## 2018-10-29 LAB — COMPREHENSIVE METABOLIC PANEL
ALT: 26 U/L (ref 0–44)
AST: 42 U/L — ABNORMAL HIGH (ref 15–41)
Albumin: 2.8 g/dL — ABNORMAL LOW (ref 3.5–5.0)
Alkaline Phosphatase: 167 U/L — ABNORMAL HIGH (ref 38–126)
Anion gap: 12 (ref 5–15)
BUN: 14 mg/dL (ref 8–23)
CO2: 19 mmol/L — ABNORMAL LOW (ref 22–32)
Calcium: 8.4 mg/dL — ABNORMAL LOW (ref 8.9–10.3)
Chloride: 112 mmol/L — ABNORMAL HIGH (ref 98–111)
Creatinine, Ser: 1.36 mg/dL — ABNORMAL HIGH (ref 0.61–1.24)
GFR calc Af Amer: 58 mL/min — ABNORMAL LOW (ref >60)
GFR calc non Af Amer: 50 mL/min — ABNORMAL LOW (ref >60)
Glucose, Bld: 95 mg/dL (ref 70–99)
Potassium: 3.8 mmol/L (ref 3.5–5.1)
Sodium: 143 mmol/L (ref 135–145)
Total Bilirubin: 0.8 mg/dL (ref 0.3–1.2)
Total Protein: 4.9 g/dL — ABNORMAL LOW (ref 6.5–8.1)

## 2018-10-29 LAB — TYPE AND SCREEN
ABO/RH(D): A POS
Antibody Screen: NEGATIVE
Unit division: 0

## 2018-10-29 LAB — CBC
HCT: 27.9 % — ABNORMAL LOW (ref 39.0–52.0)
Hemoglobin: 9.1 g/dL — ABNORMAL LOW (ref 13.0–17.0)
MCH: 29.5 pg (ref 26.0–34.0)
MCHC: 32.6 g/dL (ref 30.0–36.0)
MCV: 90.6 fL (ref 80.0–100.0)
Platelets: 152 10*3/uL (ref 150–400)
RBC: 3.08 MIL/uL — ABNORMAL LOW (ref 4.22–5.81)
RDW: 15.5 % (ref 11.5–15.5)
WBC: 9.2 10*3/uL (ref 4.0–10.5)
nRBC: 0 % (ref 0.0–0.2)

## 2018-10-29 LAB — MAGNESIUM: Magnesium: 2.2 mg/dL (ref 1.7–2.4)

## 2018-10-29 LAB — BPAM RBC
Blood Product Expiration Date: 202010262359
ISSUE DATE / TIME: 202010060426
Unit Type and Rh: 6200

## 2018-10-29 LAB — APTT: aPTT: 107 seconds — ABNORMAL HIGH (ref 24–36)

## 2018-10-29 LAB — HEPARIN LEVEL (UNFRACTIONATED): Heparin Unfractionated: 0.43 IU/mL (ref 0.30–0.70)

## 2018-10-29 MED ORDER — PANTOPRAZOLE SODIUM 40 MG PO TBEC
40.0000 mg | DELAYED_RELEASE_TABLET | Freq: Two times a day (BID) | ORAL | Status: DC
  Administered 2018-10-29 – 2018-10-31 (×6): 40 mg via ORAL
  Filled 2018-10-29 (×6): qty 1

## 2018-10-29 MED ORDER — TRAMADOL HCL 50 MG PO TABS
50.0000 mg | ORAL_TABLET | Freq: Once | ORAL | Status: AC
  Administered 2018-10-29: 02:00:00 50 mg via ORAL
  Filled 2018-10-29: qty 1

## 2018-10-29 MED ORDER — ONDANSETRON HCL 4 MG/2ML IJ SOLN
4.0000 mg | Freq: Four times a day (QID) | INTRAMUSCULAR | Status: DC | PRN
  Administered 2018-10-29 – 2018-10-31 (×3): 4 mg via INTRAVENOUS
  Filled 2018-10-29 (×3): qty 2

## 2018-10-29 NOTE — Progress Notes (Signed)
PROGRESS NOTE    Scott Harper  [id number] DOB: 03-13-1940 DOA: 10/30/2018 PCP: Patient, No Pcp Per   Brief Narrative:   Scott Harper is a 78 y.o. 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  Patient was admitted to the hospital last month for acute bilateral PE and possible pneumonia versus pulmonary infarct.  He was started on Eliquis.  Patient reports having shortness of breath for the past few weeks.  Denies cough.  States he has noticed blood in his stool since he started taking Eliquis.  He has not vomited any blood.  He has also been having epigastric abdominal pain.  Denies history of prior GI bleed.  States he stopped taking Eliquis 4 to 5 days ago as he was noticing blood in his stool.  No additional history could be obtained from him.  ED Course: Not hypoxic, placed on nonrebreather by EMS for comfort.  Blood pressure soft.  Slightly tachypneic.  Hemoglobin 8.3, was 11.3 on 09/28/2018.  BNP normal.  High-sensitivity troponin 108.  EKG not suggestive of ACS.  FOBT positive.  SARS-CoV-2 test pending. Chest x-ray showing no active disease. Patient received 1 L normal saline bolus.  1 unit PRBCs ordered.    Assessment & Plan:   Principal Problem:   Pulmonary embolism (HCC) Active Problems:   Metastatic cancer [facility])   Hospice care patient   GI bleed   Acute blood loss anemia   Metastatic colon cancer to liver [facility])   Subacute bilateral pulmonary embolism with evidence of worsening right heart strain Pulmonary infarct Elevated troponin, likely demand ischemia from right heart strain secondary to PE Recently discharged on 09/26/2018 for acute bilateral PE in the setting of metastatic cancer, and discharged on Eliquis.  He has failed Eliquis as repeat CT angiogram showing bilateral PE  and evidence of worsening right heart strain (RV to LV ratio of 2.2).  There is evidence of new emboli and clot propagation from older emboli.  Wedge-shaped area of opacity in the lingula, likely an infarct. High-sensitivity troponin elevated in the setting of right heart strain (108 > 197 > 277).  EKG without acute ischemic changes.  Difficult situation as patient has also been having intermittent hematochezia since he was started on Eliquis and hemoglobin with 3 g drop compared to labs done during hospitalization a month ago.  FOBT positive and stool mixed with blood on exam done by ED provider.  Case was discussed with Dr. Ronnette Juniper from PCCM and Dr. Laurence Ferrari with IR by admitting provider; and was deemed patient not a candidate for thrombolytics given active GI bleed.  The only option at this point is trying unfractionated heparin at a low dose and monitoring closely for worsening GI bleed.    Echocardiogram with EF [id number], diastolic dysfunction, RV pressure overload, mild RV reduced function, positive McConnell sign, RA moderately dilated, mild TR, normal IVC.   --Continue IV heparin infusion --Continue to monitor hemoglobin closely while on heparin drip with GI bleed as below  Acute blood loss anemia secondary to acute GI bleed, likely lower Patient presenting with shortness of breath.  Hemoglobin on admission noted to be 8.3, which is a 3 g drop since 09/28/2018 with hemoglobin 11.3.  Recently started on Eliquis for PE as above.  FOBT positive and stool mixed with blood on exam during admission. Etiology likely  secondary to anticoagulation and further complicated by his history of metastatic colorectal cancer, presum lower GIB at this point as he initially stated some of bright red blood in his stool. --[facility] consulted, appreciate assistance --s/p 1u pRBC on 10/6 --Hgb 8.3-->8.9-->9.0-->9.7-->9.1; currently appears has stabilized --PPI drip transition to Protonix 40 mg p.o. twice daily  today --Continue clear liquid diet --If bleeding picks up or persists, GI possibly considering colonoscopy  Acute left lower extremity DVT Lower externally venous duplex scan notable for acute DVT in the left common femoral,, left femoral, left popliteal, left posterior tibial veins.  Of which this is a new finding since last venous duplex scan back in early September 2020.  This is another confirmation that he has failed outpatient Eliquis therapy. 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  Patient is followed [facility], Dr. Estelle Grumbles.  Last seen in office on 07/18/2018 with repeat CT scan at that time showing new and a large liver lesions with recommendations of hospice. --Palliative care following, appreciate assistance   DVT prophylaxis: SCDs, heparin drip Code Status: DNR Family Communication: Unable to contact daughter via telephone this afternoon, will reattempt later. Disposition Plan: Transfer to floor today, consulted GI for evaluation, may need IVC filter placement in which patient wants to further discuss with his daughter, unable to reach her via telephone this afternoon, continue heparin drip, further dependent on clinical course; ultimately Cram/poor prognosis.  Consultants:   Case was discussed by admitting provider with interventional radiology and PCCM.  Procedures:   none  Antimicrobials:   none   Subjective: Patient seen and examined bedside, resting comfortably.  States breathing and shortness of breath have improved.  Continues with fatigue and  weakness.  No family present during exam today.  Discussed with patient once again that he has to acute medical conditions that are contradictory in terms of treatment with blood clots in his lung and leg coupled with acute GI bleed, presumed diverticular.  Fortunately, patient's hemoglobin seems to have stabilized on heparin drip.  Patient was seen by GI today with recommendations of current care, and to consider colonoscopy if bleeding worsens or does not improve.  Patient continues to complain of some very light red blood in his stool, but improved.  Also informed patient that he does have an acute DVT in his left lower extremity, which is a new finding since his previous hospitalization; furthermore confirming treatment failure with Eliquis. Patient without any further complaints.  Denies headache, no fever/chills/night sweats, no chest pain, palpitations, no cough/congestion.  No acute events overnight per nursing staff.  Objective: Vitals:   10/29/18 0700 10/29/18 0800 10/29/18 0851 10/29/18 0930  BP: 120/66 (!) 146/84  127/77  Pulse: 66 65  75  Resp: 17 19  16   Temp:  97.9 F (36.6 C)  98.2 F (36.8 C)  TempSrc:  Oral  Oral  SpO2: 95% 96% 94% 100%  Weight:      Height:        Intake/Output Summary (Last 24 hours) at 10/29/2018 1404 Last data filed at 10/29/2018 1223 Gross per 24 hour  Intake 1065.48 ml  Output 375 ml  Net 690.48 ml   Filed Weights   10/28/18 0413  Weight: 53.9 kg    Examination:  General exam: Appears calm and comfortable, thin and cachectic in appearance Respiratory system: Clear to auscultation. Respiratory effort normal.  Oxygenating well on room air Cardiovascular system: S1 & S2 heard, RRR. No JVD, murmurs, rubs, gallops or clicks. No pedal edema. Gastrointestinal system: Abdomen is nondistended, soft and nontender. No organomegaly or masses felt. Normal bowel sounds heard. Central nervous system: Alert and oriented. No focal neurological  deficits. Extremities: Symmetric 5 x 5 power. Skin: No rashes, lesions or ulcers Psychiatry: Judgement and insight appear normal. Mood & affect appropriate.     Data Reviewed: I have personally reviewed following labs and imaging studies  CBC: Recent Labs  Lab 11/14/2018 2012 10/28/18 0953 10/28/18 1730 10/28/18 2337 10/29/18 0600  WBC 7.4 7.6  --   --  9.2  HGB 8.3* 8.9* 9.0* 9.7* 9.1*  HCT 26.6* 27.8* 28.2* 30.0* 27.9*  MCV 91.4 90.6  --   --  90.6  PLT 134* 126*  --   --  0000000   Basic Metabolic Panel: Recent Labs  Lab 11/14/2018 2012 10/29/18 0600  NA 139 143  K 3.5 3.8  CL 107 112*  CO2 22 19*  GLUCOSE 106* 95  BUN 12 14  CREATININE 1.47* 1.36*  CALCIUM 8.6* 8.4*  MG  --  2.2   GFR: Estimated Creatinine Clearance: 34.7 mL/min (A) (by C-G formula based on SCr of 1.36 mg/dL (H)). Liver Function Tests: Recent Labs  Lab 10/26/2018 2012 10/29/18 0600  AST 64* 42*  ALT 34 26  ALKPHOS 203* 167*  BILITOT 0.9 0.8  PROT 5.7* 4.9*  ALBUMIN 3.0* 2.8*   No results for input(s): LIPASE, AMYLASE in the last 168 hours. No results for input(s): AMMONIA in the last 168 hours. Coagulation Profile: No results for input(s): INR, PROTIME in the last 168 hours. Cardiac Enzymes: No results for input(s): CKTOTAL, CKMB, CKMBINDEX, TROPONINI in the last 168 hours. BNP (last 3 results) No results for input(s): PROBNP in the last 8760 hours. HbA1C: No results for input(s): HGBA1C in the last 72 hours. CBG: No results for input(s): GLUCAP in the last 168 hours. Lipid Profile: No results for input(s): CHOL, HDL, LDLCALC, TRIG, CHOLHDL, LDLDIRECT in the last 72 hours. Thyroid Function Tests: No results for input(s): TSH, T4TOTAL, FREET4, T3FREE, THYROIDAB in the last 72 hours. Anemia Panel: No results for input(s): VITAMINB12, FOLATE, FERRITIN, TIBC, IRON, RETICCTPCT in the last 72 hours. Sepsis Labs: No results for input(s): PROCALCITON, LATICACIDVEN in the last 168  hours.  Recent Results (from the past 240 hour(s))  SARS CORONAVIRUS 2 (TAT 6-24 HRS) Nasopharyngeal Nasopharyngeal Swab     Status: None   Collection Time: 11/17/2018 11:32 PM   Specimen: Nasopharyngeal Swab  Result Value Ref Range Status   SARS Coronavirus 2 NEGATIVE NEGATIVE Final    Comment: (NOTE) SARS-CoV-2 target nucleic acids are NOT DETECTED. The SARS-CoV-2 RNA is generally detectable in upper and lower respiratory specimens during the acute phase of infection. Negative results do not preclude SARS-CoV-2 infection, do not rule out co-infections with other pathogens, and should not be used as the sole basis for treatment or other patient management decisions. Negative results must be combined with clinical observations, patient history, and epidemiological information. The expected result is Negative. Fact Sheet for Patients: SugarRoll.be Fact Sheet for Healthcare Providers: https://www.woods-mathews.com/ This test is not yet approved or cleared by the [place] FDA  and  has been authorized for detection and/or diagnosis of SARS-CoV-2 by FDA under an Emergency Use Authorization (EUA). This EUA will remain  in effect (meaning this test can be used) for the duration of the COVID-19 declaration under Section 56 4(b)(1) of the Act, 21 U.S.C. section 360bbb-3(b)(1), unless the authorization is terminated or revoked sooner. Performed at [facility], [place] [address], [place], [place] [address]   MRSA PCR Screening     Status: None   Collection Time: 10/28/18  3:39 AM   Specimen: Nasal Mucosa; Nasopharyngeal  Result Value Ref Range Status   MRSA by PCR NEGATIVE NEGATIVE Final    Comment:        The GeneXpert MRSA Assay (FDA approved for NASAL specimens only), is one component of a comprehensive MRSA colonization surveillance program. It is not intended to diagnose MRSA infection nor to guide or monitor treatment for MRSA  infections. Performed at [facility], [place] [address], [place], [place] [address]          Radiology Studies: Ct Angio Chest Pe W Or Wo Contrast  Result Date: 10/28/2018 CLINICAL DATA:  Shortness of breath and gastrointestinal bleed. EXAM: CT ANGIOGRAPHY CHEST CT ABDOMEN AND PELVIS WITH CONTRAST TECHNIQUE: Multidetector CT imaging of the chest was performed using the standard protocol during bolus administration of intravenous contrast. Multiplanar CT image reconstructions and MIPs were obtained to evaluate the vascular anatomy. Multidetector CT imaging of the abdomen and pelvis was performed using the standard protocol during bolus administration of intravenous contrast. CONTRAST:  5mL OMNIPAQUE IOHEXOL 350 MG/ML SOLN COMPARISON:  CTA chest 09/26/2018 FINDINGS: CTA CHEST FINDINGS Cardiovascular: Contrast injection is sufficient to demonstrate satisfactory opacification of the pulmonary arteries to the segmental level.There are multiple bilateral pulmonary artery filling defects. There are new filling defects that are proximal to the previously demonstrated emboli in the middle and lower lobes. Emboli in the right upper lobe appear to be new. On the left, there are new filling defects in the upper lobe and increase in the clot burden in the left lower lobe. The main pulmonary artery is within normal limits for size. There is CT evidence of right heart strain with an RV to LV ratio of 2.2 . there is mild aortic atherosclerosis there is a normal 3-vessel arch branching pattern. Heart size is mildly enlarged. No pericardial effusion. Mediastinum/Nodes: No mediastinal, hilar or axillary lymphadenopathy. The visualized thyroid and thoracic esophageal course are unremarkable. Lungs/Pleura: There are innumerable bilateral nodular opacities. There is fluid along the right major fissure. There is a wedge-shaped area of opacity in the lingula that likely indicates an infarct. There are multiple  pleural-based nodules within both lungs. Musculoskeletal: No chest wall abnormality. No acute or significant osseous findings. Review of the MIP images confirms the above findings. CT ABDOMEN and PELVIS FINDINGS Hepatobiliary: Numerous hypoattenuating hepatic masses, the largest of which measures 3.6 cm. Normal gallbladder. Pancreas: Normal contours without ductal dilatation. No peripancreatic fluid collection. Spleen: Normal. Adrenals/Urinary Tract: --Adrenal glands: Normal. --Right kidney/ureter: No hydronephrosis or perinephric stranding. No nephrolithiasis. No obstructing ureteral stones. --Left kidney/ureter: Multiple renal cyst measuring up to 28 mm. --Urinary bladder: Unremarkable. Stomach/Bowel: --Stomach/Duodenum: No hiatal hernia or other gastric abnormality. Normal duodenal course and caliber. --Small bowel: No dilatation or inflammation. --Colon: There is a anastomosis at the transverse colon. No other focal colonic abnormality is visible. --Appendix: Not visualized. No right lower quadrant inflammation or free fluid. Vascular/Lymphatic: Atherosclerotic calcification is present within the non-aneurysmal abdominal  aorta, without hemodynamically significant stenosis. No abdominal or pelvic lymphadenopathy. Reproductive: Mildly enlarged prostate Musculoskeletal. No bony spinal canal stenosis or focal osseous abnormality. Other: None. IMPRESSION: 1. Bilateral pulmonary emboli with CT evidence of right heart strain (RV to LV ratio of 2.2). The emboli most likely represent a combination of new emboli and clot propagation from older emboli. 2. Innumerable bilateral pulmonary parenchymal and pleural based nodules, consistent with metastatic disease. 3. Wedge-shaped area of opacity in the lingula, likely an infarct. 4. Numerous liver metastases. 5. Aortic Atherosclerosis (ICD10-I70.0). Critical Value/emergent results were called by telephone at the time of interpretation on 10/28/2018 at 2:53 am to Dr. Shela Leff , who verbally acknowledged these results. Electronically Signed   By: Ulyses Jarred M.D.   On: 10/28/2018 02:53   Ct Abdomen Pelvis W Contrast  Result Date: 10/28/2018 CLINICAL DATA:  Shortness of breath and gastrointestinal bleed. EXAM: CT ANGIOGRAPHY CHEST CT ABDOMEN AND PELVIS WITH CONTRAST TECHNIQUE: Multidetector CT imaging of the chest was performed using the standard protocol during bolus administration of intravenous contrast. Multiplanar CT image reconstructions and MIPs were obtained to evaluate the vascular anatomy. Multidetector CT imaging of the abdomen and pelvis was performed using the standard protocol during bolus administration of intravenous contrast. CONTRAST:  20mL OMNIPAQUE IOHEXOL 350 MG/ML SOLN COMPARISON:  CTA chest 09/26/2018 FINDINGS: CTA CHEST FINDINGS Cardiovascular: Contrast injection is sufficient to demonstrate satisfactory opacification of the pulmonary arteries to the segmental level.There are multiple bilateral pulmonary artery filling defects. There are new filling defects that are proximal to the previously demonstrated emboli in the middle and lower lobes. Emboli in the right upper lobe appear to be new. On the left, there are new filling defects in the upper lobe and increase in the clot burden in the left lower lobe. The main pulmonary artery is within normal limits for size. There is CT evidence of right heart strain with an RV to LV ratio of 2.2 . there is mild aortic atherosclerosis there is a normal 3-vessel arch branching pattern. Heart size is mildly enlarged. No pericardial effusion. Mediastinum/Nodes: No mediastinal, hilar or axillary lymphadenopathy. The visualized thyroid and thoracic esophageal course are unremarkable. Lungs/Pleura: There are innumerable bilateral nodular opacities. There is fluid along the right major fissure. There is a wedge-shaped area of opacity in the lingula that likely indicates an infarct. There are multiple pleural-based  nodules within both lungs. Musculoskeletal: No chest wall abnormality. No acute or significant osseous findings. Review of the MIP images confirms the above findings. CT ABDOMEN and PELVIS FINDINGS Hepatobiliary: Numerous hypoattenuating hepatic masses, the largest of which measures 3.6 cm. Normal gallbladder. Pancreas: Normal contours without ductal dilatation. No peripancreatic fluid collection. Spleen: Normal. Adrenals/Urinary Tract: --Adrenal glands: Normal. --Right kidney/ureter: No hydronephrosis or perinephric stranding. No nephrolithiasis. No obstructing ureteral stones. --Left kidney/ureter: Multiple renal cyst measuring up to 28 mm. --Urinary bladder: Unremarkable. Stomach/Bowel: --Stomach/Duodenum: No hiatal hernia or other gastric abnormality. Normal duodenal course and caliber. --Small bowel: No dilatation or inflammation. --Colon: There is a anastomosis at the transverse colon. No other focal colonic abnormality is visible. --Appendix: Not visualized. No right lower quadrant inflammation or free fluid. Vascular/Lymphatic: Atherosclerotic calcification is present within the non-aneurysmal abdominal aorta, without hemodynamically significant stenosis. No abdominal or pelvic lymphadenopathy. Reproductive: Mildly enlarged prostate Musculoskeletal. No bony spinal canal stenosis or focal osseous abnormality. Other: None. IMPRESSION: 1. Bilateral pulmonary emboli with CT evidence of right heart strain (RV to LV ratio of 2.2). The emboli most likely represent a  combination of new emboli and clot propagation from older emboli. 2. Innumerable bilateral pulmonary parenchymal and pleural based nodules, consistent with metastatic disease. 3. Wedge-shaped area of opacity in the lingula, likely an infarct. 4. Numerous liver metastases. 5. Aortic Atherosclerosis (ICD10-I70.0). Critical Value/emergent results were called by telephone at the time of interpretation on 10/28/2018 at 2:53 am to Dr. Shela Leff , who  verbally acknowledged these results. Electronically Signed   By: Ulyses Jarred M.D.   On: 10/28/2018 02:53   Dg Chest Port 1 View  Result Date: 10/25/2018 CLINICAL DATA:  Shortness of breath EXAM: PORTABLE CHEST 1 VIEW COMPARISON:  09/26/2018 FINDINGS: The heart size and mediastinal contours are within normal limits. Both lungs are clear. The visualized skeletal structures are unremarkable. There is a right chest wall power-injectable Port-A-Cath with tip in the lower SVC via a right internal jugular vein approach. IMPRESSION: No active disease. Electronically Signed   By: Ulyses Jarred M.D.   On: 11/05/2018 21:07   Vas [place] Lower Extremity Venous (dvt)  Result Date: 10/29/2018  Lower Venous Study Indications: Pulmonary embolism.  Comparison Study: previous study done 09/27/18 Performing Technologist: Abram Sander RVS  Examination Guidelines: A complete evaluation includes B-mode imaging, spectral Doppler, color Doppler, and power Doppler as needed of all accessible portions of each vessel. Bilateral testing is considered an integral part of a complete examination. Limited examinations for reoccurring indications may be performed as noted.  +---------+---------------+---------+-----------+----------+--------------+  RIGHT     Compressibility Phasicity Spontaneity Properties Thrombus Aging  +---------+---------------+---------+-----------+----------+--------------+  CFV       Full            Yes       Yes                                    +---------+---------------+---------+-----------+----------+--------------+  SFJ       Full                                                             +---------+---------------+---------+-----------+----------+--------------+  FV Prox   Full                                                             +---------+---------------+---------+-----------+----------+--------------+  FV Mid    Full                                                              +---------+---------------+---------+-----------+----------+--------------+  FV Distal Full                                                             +---------+---------------+---------+-----------+----------+--------------+  PFV  Full                                                             +---------+---------------+---------+-----------+----------+--------------+  POP       Full            Yes       Yes                                    +---------+---------------+---------+-----------+----------+--------------+  PTV       Full                                                             +---------+---------------+---------+-----------+----------+--------------+  PERO                                                       Not visualized  +---------+---------------+---------+-----------+----------+--------------+   +---------+---------------+---------+-----------+----------+--------------+  LEFT      Compressibility Phasicity Spontaneity Properties Thrombus Aging  +---------+---------------+---------+-----------+----------+--------------+  CFV       None            No        No                                     +---------+---------------+---------+-----------+----------+--------------+  SFJ       Full                                                             +---------+---------------+---------+-----------+----------+--------------+  FV Prox   None                                                             +---------+---------------+---------+-----------+----------+--------------+  FV Mid    None                                                             +---------+---------------+---------+-----------+----------+--------------+  FV Distal None                                                             +---------+---------------+---------+-----------+----------+--------------+  PFV       None                                                              +---------+---------------+---------+-----------+----------+--------------+  POP       None            No        No                                     +---------+---------------+---------+-----------+----------+--------------+  PTV       None                                                             +---------+---------------+---------+-----------+----------+--------------+  PERO                                                       Not visualized  +---------+---------------+---------+-----------+----------+--------------+     Summary: Right: There is no evidence of deep vein thrombosis in the lower extremity. No cystic structure found in the popliteal fossa. Left: Findings consistent with acute deep vein thrombosis involving the left common femoral vein, left femoral vein, left proximal profunda vein, left popliteal vein, and left posterior tibial veins. No cystic structure found in the popliteal fossa.  *See table(s) above for measurements and observations.    Preliminary         Scheduled Meds:  Chlorhexidine Gluconate Cloth  6 each Topical Daily   influenza vaccine adjuvanted  0.5 mL Intramuscular Tomorrow-1000   mouth rinse  15 mL Mouth Rinse BID   mometasone-formoterol  2 puff Inhalation BID   Continuous Infusions:  heparin 800 Units/hr (10/29/18 0558)   pantoprozole (PROTONIX) infusion 8 mg/hr (10/29/18 1223)     LOS: 1 day    Time spent: 39 minutes spent on chart review, discussion with nursing staff, consultants, updating family and interview/physical exam; more than 50% of that time was spent in counseling and/or coordination of care.   Patria Warzecha J [place] DO [facility] Pager [phone]  If 7PM-7AM, please contact night-coverage [email or web address] Password [email or web address] 10/29/2018, 2:04 PM

## 2018-10-29 NOTE — Consult Note (Signed)
Reason for Consult: GI bleed Referring Physician: Hospital team  Scott Harper is an 78 y.o. male.  HPI: Patient seen and examined and case discussed with the hospital team and his hospital computer chart reviewed and he had surgery for his colon cancer 2 years ago and denies a colonoscopy since and has been passing bright red blood for 2 days but he says it seems to be getting lighter and he denies any black bowel movements and has been on blood thinners for pulmonary emboli for the last month but he does not have any pain or vomiting is tolerating clear liquids and has no other complaints  Past Medical History:  Diagnosis Date  . Cancer [facility])    colon     Past Surgical History:  Procedure Laterality Date  . COLON SURGERY  2018  . TONSILLECTOMY      Family History  Family history unknown: Yes    Social History:  reports that he has been smoking cigarettes. He has never used smokeless tobacco. He reports current alcohol use. He reports current drug use. Drugs: Oxycodone and Morphine.  Allergies: No Known Allergies  Medications: I have reviewed the patient's current medications.  Results for orders placed or performed during the hospital encounter of 11/04/2018 (from the past 48 hour(s))  CBC     Status: Abnormal   Collection Time: 11/19/2018  8:12 PM  Result Value Ref Range   WBC 7.4 4.0 - 10.5 K/uL   RBC 2.91 (L) 4.22 - 5.81 MIL/uL   Hemoglobin 8.3 (L) 13.0 - 17.0 g/dL   HCT 26.6 (L) 39.0 - 52.0 %   MCV 91.4 80.0 - 100.0 fL   MCH 28.5 26.0 - 34.0 pg   MCHC 31.2 30.0 - 36.0 g/dL   RDW 14.6 11.5 - 15.5 %   Platelets 134 (L) 150 - 400 K/uL   nRBC 0.0 0.0 - 0.2 %    Comment: Performed at [facility], [place] [address] [place], [place] [address]  Comprehensive metabolic panel     Status: Abnormal   Collection Time: 11/20/2018  8:12 PM  Result Value Ref Range   Sodium 139 135 - 145 mmol/L   Potassium 3.5 3.5 - 5.1 mmol/L   Chloride 107 98 - 111 mmol/L   CO2  22 22 - 32 mmol/L   Glucose, Bld 106 (H) 70 - 99 mg/dL   BUN 12 8 - 23 mg/dL   Creatinine, Ser 1.47 (H) 0.61 - 1.24 mg/dL   Calcium 8.6 (L) 8.9 - 10.3 mg/dL   Total Protein 5.7 (L) 6.5 - 8.1 g/dL   Albumin 3.0 (L) 3.5 - 5.0 g/dL   AST 64 (H) 15 - 41 U/L   ALT 34 0 - 44 U/L   Alkaline Phosphatase 203 (H) 38 - 126 U/L   Total Bilirubin 0.9 0.3 - 1.2 mg/dL   GFR calc non Af Amer 45 (L) >60 mL/min   GFR calc Af Amer 53 (L) >60 mL/min   Anion gap 10 5 - 15    Comment: Performed at [facility], [place] [address], [place], [place] [address]  Brain natriuretic peptide     Status: None   Collection Time: 11/22/2018  8:12 PM  Result Value Ref Range   B Natriuretic Peptide 39.6 0.0 - 100.0 pg/mL    Comment: Performed at [facility], [place] [address], [place], [place] [address]  Troponin I (High Sensitivity)     Status: Abnormal  Collection Time: 11/05/2018  8:12 PM  Result Value Ref Range   Troponin I (High Sensitivity) 108 (HH) <18 ng/L    Comment: CRITICAL RESULT CALLED TO, READ BACK BY AND VERIFIED WITH: REBECCA GRAVES, RN @ 2239 ON 10/31/2018 C VARNER (NOTE) Elevated high sensitivity troponin I (hsTnI) values and significant  changes across serial measurements may suggest ACS but many other  chronic and acute conditions are known to elevate hsTnI results.  Refer to the Links section for chest pain algorithms and additional  guidance. Performed at [facility], [place] [address] [place], [place] [address]   Troponin I (High Sensitivity)     Status: Abnormal   Collection Time: 10/29/2018 11:08 PM  Result Value Ref Range   Troponin I (High Sensitivity) 197 (HH) <18 ng/L    Comment: CRITICAL VALUE NOTED.  VALUE IS CONSISTENT WITH PREVIOUSLY REPORTED AND CALLED VALUE. (NOTE) Elevated high sensitivity troponin I (hsTnI) values and significant  changes across serial measurements may suggest ACS but many other  chronic and acute conditions are  known to elevate hsTnI results.  Refer to the Links section for chest pain algorithms and additional  guidance. Performed at [facility], [place] [address], [place], [place] [address]   Type and screen [place]     Status: None   Collection Time: 10/26/2018 11:08 PM  Result Value Ref Range   ABO/RH(D) A POS    Antibody Screen NEG    Sample Expiration 10/30/2018,2359    Unit Number [id number]    Blood Component Type RED CELLS,LR    Unit division 00    Status of Unit ISSUED,FINAL    Transfusion Status OK TO TRANSFUSE    Crossmatch Result      Compatible Performed at [facility], [place] [address] [place], [place] [address]   Prepare RBC     Status: None   Collection Time: 11/03/2018 11:08 PM  Result Value Ref Range   Order Confirmation      ORDER PROCESSED BY BLOOD BANK Performed at [facility], [place] [address], [place], [place] [address]   ABO/Rh     Status: None   Collection Time: 10/29/2018 11:08 PM  Result Value Ref Range   ABO/RH(D)      A POS Performed at [facility], [place] [address] [place], [place] [address]   POC occult blood, ED     Status: Abnormal   Collection Time: 11/08/2018 11:31 PM  Result Value Ref Range   Fecal Occult Bld POSITIVE (A) NEGATIVE  SARS CORONAVIRUS 2 (TAT 6-24 HRS) Nasopharyngeal Nasopharyngeal Swab     Status: None   Collection Time: 11/14/2018 11:32 PM   Specimen: Nasopharyngeal Swab  Result Value Ref Range   SARS Coronavirus 2 NEGATIVE NEGATIVE    Comment: (NOTE) SARS-CoV-2 target nucleic acids are NOT DETECTED. The SARS-CoV-2 RNA is generally detectable in upper and lower respiratory specimens during the acute phase of infection. Negative results do not preclude SARS-CoV-2 infection, do not rule out co-infections with other pathogens, and should not be used as the sole basis for treatment or other patient management decisions. Negative results  must be combined with clinical observations, patient history, and epidemiological information. The expected result is Negative. Fact Sheet for Patients: SugarRoll.be Fact Sheet for Healthcare Providers: https://www.woods-mathews.com/ This test is not yet approved or cleared by the [place] FDA and  has been authorized for detection and/or diagnosis of SARS-CoV-2 by FDA under  an Emergency Use Authorization (EUA). This EUA will remain  in effect (meaning this test can be used) for the duration of the COVID-19 declaration under Section 56 4(b)(1) of the Act, 21 U.S.C. section 360bbb-3(b)(1), unless the authorization is terminated or revoked sooner. Performed at [facility], [place] [address], [place], [place] [address]   MRSA PCR Screening     Status: None   Collection Time: 10/28/18  3:39 AM   Specimen: Nasal Mucosa; Nasopharyngeal  Result Value Ref Range   MRSA by PCR NEGATIVE NEGATIVE    Comment:        The GeneXpert MRSA Assay (FDA approved for NASAL specimens only), is one component of a comprehensive MRSA colonization surveillance program. It is not intended to diagnose MRSA infection nor to guide or monitor treatment for MRSA infections. Performed at [facility], [place] [address] [place], [place] [address]   Troponin I (High Sensitivity)     Status: Abnormal   Collection Time: 10/28/18  3:44 AM  Result Value Ref Range   Troponin I (High Sensitivity) 277 (HH) <18 ng/L    Comment: DELTA CHECK NOTED CRITICAL RESULT CALLED TO, READ BACK BY AND VERIFIED WITH: Harmon Dun, RN @ [phone] ON 10/28/2018 C VARNER (NOTE) Elevated high sensitivity troponin I (hsTnI) values and significant  changes across serial measurements may suggest ACS but many other  chronic and acute conditions are known to elevate hsTnI results.  Refer to the Links section for chest pain algorithms and additional  guidance. Performed at  [facility], [place] [address] [place], [place] [address]   APTT     Status: Abnormal   Collection Time: 10/28/18  3:44 AM  Result Value Ref Range   aPTT 39 (H) 24 - 36 seconds    Comment:        IF BASELINE aPTT IS ELEVATED, SUGGEST PATIENT RISK ASSESSMENT BE USED TO DETERMINE APPROPRIATE ANTICOAGULANT THERAPY. Performed at [facility], [place] [address], [place], [place] [address]   Heparin level (unfractionated)     Status: Abnormal   Collection Time: 10/28/18  3:44 AM  Result Value Ref Range   Heparin Unfractionated 0.23 (L) 0.30 - 0.70 IU/mL    Comment: (NOTE) If heparin results are below expected values, and patient dosage has  been confirmed, suggest follow up testing of antithrombin III levels. Performed at [facility], [place] [address] [place], [place] [address]   CBC     Status: Abnormal   Collection Time: 10/28/18  9:53 AM  Result Value Ref Range   WBC 7.6 4.0 - 10.5 K/uL   RBC 3.07 (L) 4.22 - 5.81 MIL/uL   Hemoglobin 8.9 (L) 13.0 - 17.0 g/dL   HCT 27.8 (L) 39.0 - 52.0 %   MCV 90.6 80.0 - 100.0 fL   MCH 29.0 26.0 - 34.0 pg   MCHC 32.0 30.0 - 36.0 g/dL   RDW 14.6 11.5 - 15.5 %   Platelets 126 (L) 150 - 400 K/uL    Comment: REPEATED TO VERIFY PLATELET COUNT CONFIRMED BY SMEAR SPECIMEN CHECKED FOR CLOTS Immature Platelet Fraction may be clinically indicated, consider ordering this additional test [id number]    nRBC 0.0 0.0 - 0.2 %    Comment: Performed at [facility], [place] [address], [place], [place] [address]  Brain natriuretic peptide     Status: Abnormal   Collection Time: 10/28/18  9:53 AM  Result Value Ref Range   B  Natriuretic Peptide 351.6 (H) 0.0 - 100.0 pg/mL    Comment: Performed at [facility], [place] [address] [place], [place] [address]  Heparin level (unfractionated)     Status: None   Collection Time: 10/28/18  1:30 PM  Result Value Ref Range   Heparin  Unfractionated 0.67 0.30 - 0.70 IU/mL    Comment: (NOTE) If heparin results are below expected values, and patient dosage has  been confirmed, suggest follow up testing of antithrombin III levels. Performed at [facility], [place] [address] [place], [place] [address]   APTT     Status: Abnormal   Collection Time: 10/28/18  1:30 PM  Result Value Ref Range   aPTT 154 (H) 24 - 36 seconds    Comment:        IF BASELINE aPTT IS ELEVATED, SUGGEST PATIENT RISK ASSESSMENT BE USED TO DETERMINE APPROPRIATE ANTICOAGULANT THERAPY. REPEATED TO VERIFY Performed at [facility], [place] [address], [place], [place] [address]   Troponin I (High Sensitivity)     Status: Abnormal   Collection Time: 10/28/18  1:30 PM  Result Value Ref Range   Troponin I (High Sensitivity) 153 (HH) <18 ng/L    Comment: DELTA CHECK NOTED CRITICAL VALUE NOTED.  VALUE IS CONSISTENT WITH PREVIOUSLY REPORTED AND CALLED VALUE. (NOTE) Elevated high sensitivity troponin I (hsTnI) values and significant  changes across serial measurements may suggest ACS but many other  chronic and acute conditions are known to elevate hsTnI results.  Refer to the Links section for chest pain algorithms and additional  guidance. Performed at [facility], [place] [address] [place], [place] [address]   Hemoglobin and hematocrit, blood     Status: Abnormal   Collection Time: 10/28/18  5:30 PM  Result Value Ref Range   Hemoglobin 9.0 (L) 13.0 - 17.0 g/dL   HCT 28.2 (L) 39.0 - 52.0 %    Comment: Performed at [facility], [place] [address] [place], [place] [address]  Troponin I (High Sensitivity)     Status: Abnormal   Collection Time: 10/28/18  5:30 PM  Result Value Ref Range   Troponin I (High Sensitivity) 138 (HH) <18 ng/L    Comment: CRITICAL VALUE NOTED.  VALUE IS CONSISTENT WITH PREVIOUSLY REPORTED AND CALLED VALUE. (NOTE) Elevated high sensitivity troponin I  (hsTnI) values and significant  changes across serial measurements may suggest ACS but many other  chronic and acute conditions are known to elevate hsTnI results.  Refer to the Links section for chest pain algorithms and additional  guidance. Performed at [facility], [place] [address], [place], [place] [address]   Troponin I (High Sensitivity)     Status: Abnormal   Collection Time: 10/28/18  9:04 PM  Result Value Ref Range   Troponin I (High Sensitivity) 133 (HH) <18 ng/L    Comment: CRITICAL VALUE NOTED.  VALUE IS CONSISTENT WITH PREVIOUSLY REPORTED AND CALLED VALUE. (NOTE) Elevated high sensitivity troponin I (hsTnI) values and significant  changes across serial measurements may suggest ACS but many other  chronic and acute conditions are known to elevate hsTnI results.  Refer to the Links section for chest pain algorithms and additional  guidance. Performed at [facility], [place] [address], [place], [place] [address]   APTT     Status: Abnormal   Collection Time: 10/28/18 11:37 PM  Result Value Ref Range   aPTT 107 (H) 24 - 36 seconds  Comment:        IF BASELINE aPTT IS ELEVATED, SUGGEST PATIENT RISK ASSESSMENT BE USED TO DETERMINE APPROPRIATE ANTICOAGULANT THERAPY. Performed at [facility], [place] [address] [place] [address], [place], [place] [address]   Heparin level (unfractionated)     Status: None   Collection Time: 10/28/18 11:37 PM  Result Value Ref Range   Heparin Unfractionated 0.38 0.30 - 0.70 IU/mL    Comment: (NOTE) If heparin results are below expected values, and patient dosage has  been confirmed, suggest follow up testing of antithrombin III levels. Performed at [facility], [place] [address] [place], [place] [address]   Hemoglobin and hematocrit, blood     Status: Abnormal   Collection Time: 10/28/18 11:37 PM  Result Value Ref Range   Hemoglobin 9.7 (L) 13.0 - 17.0 g/dL   HCT 30.0 (L) 39.0 -  52.0 %    Comment: Performed at [facility], [place] [address], [place], [place] [address]  CBC     Status: Abnormal   Collection Time: 10/29/18  6:00 AM  Result Value Ref Range   WBC 9.2 4.0 - 10.5 K/uL   RBC 3.08 (L) 4.22 - 5.81 MIL/uL   Hemoglobin 9.1 (L) 13.0 - 17.0 g/dL   HCT 27.9 (L) 39.0 - 52.0 %   MCV 90.6 80.0 - 100.0 fL   MCH 29.5 26.0 - 34.0 pg   MCHC 32.6 30.0 - 36.0 g/dL   RDW 15.5 11.5 - 15.5 %   Platelets 152 150 - 400 K/uL   nRBC 0.0 0.0 - 0.2 %    Comment: Performed at [facility], [place] [address], [place], [place] [address]  Comprehensive metabolic panel     Status: Abnormal   Collection Time: 10/29/18  6:00 AM  Result Value Ref Range   Sodium 143 135 - 145 mmol/L   Potassium 3.8 3.5 - 5.1 mmol/L   Chloride 112 (H) 98 - 111 mmol/L   CO2 19 (L) 22 - 32 mmol/L   Glucose, Bld 95 70 - 99 mg/dL   BUN 14 8 - 23 mg/dL   Creatinine, Ser 1.36 (H) 0.61 - 1.24 mg/dL   Calcium 8.4 (L) 8.9 - 10.3 mg/dL   Total Protein 4.9 (L) 6.5 - 8.1 g/dL   Albumin 2.8 (L) 3.5 - 5.0 g/dL   AST 42 (H) 15 - 41 U/L   ALT 26 0 - 44 U/L   Alkaline Phosphatase 167 (H) 38 - 126 U/L   Total Bilirubin 0.8 0.3 - 1.2 mg/dL   GFR calc non Af Amer 50 (L) >60 mL/min   GFR calc Af Amer 58 (L) >60 mL/min   Anion gap 12 5 - 15    Comment: Performed at [facility], [place] [address], [place],  [address]  Magnesium     Status: None   Collection Time: 10/29/18  6:00 AM  Result Value Ref Range   Magnesium 2.2 1.7 - 2.4 mg/dL    Comment: Performed at [facility], [place] [address], [place], [place] [address]  Heparin level (unfractionated)     Status: None   Collection Time: 10/29/18  7:55 AM  Result Value Ref Range   Heparin Unfractionated 0.43 0.30 - 0.70 IU/mL    Comment: (NOTE) If heparin results are below expected values, and patient dosage has  been confirmed, suggest follow up testing of antithrombin III  levels. Performed at [facility], [place] [address]  [place], [place] [address]     Ct Angio Chest Pe W Or Wo Contrast  Result Date: 10/28/2018 CLINICAL DATA:  Shortness of breath and gastrointestinal bleed. EXAM: CT ANGIOGRAPHY CHEST CT ABDOMEN AND PELVIS WITH CONTRAST TECHNIQUE: Multidetector CT imaging of the chest was performed using the standard protocol during bolus administration of intravenous contrast. Multiplanar CT image reconstructions and MIPs were obtained to evaluate the vascular anatomy. Multidetector CT imaging of the abdomen and pelvis was performed using the standard protocol during bolus administration of intravenous contrast. CONTRAST:  52mL OMNIPAQUE IOHEXOL 350 MG/ML SOLN COMPARISON:  CTA chest 09/26/2018 FINDINGS: CTA CHEST FINDINGS Cardiovascular: Contrast injection is sufficient to demonstrate satisfactory opacification of the pulmonary arteries to the segmental level.There are multiple bilateral pulmonary artery filling defects. There are new filling defects that are proximal to the previously demonstrated emboli in the middle and lower lobes. Emboli in the right upper lobe appear to be new. On the left, there are new filling defects in the upper lobe and increase in the clot burden in the left lower lobe. The main pulmonary artery is within normal limits for size. There is CT evidence of right heart strain with an RV to LV ratio of 2.2 . there is mild aortic atherosclerosis there is a normal 3-vessel arch branching pattern. Heart size is mildly enlarged. No pericardial effusion. Mediastinum/Nodes: No mediastinal, hilar or axillary lymphadenopathy. The visualized thyroid and thoracic esophageal course are unremarkable. Lungs/Pleura: There are innumerable bilateral nodular opacities. There is fluid along the right major fissure. There is a wedge-shaped area of opacity in the lingula that likely indicates an infarct. There are multiple pleural-based nodules within  both lungs. Musculoskeletal: No chest wall abnormality. No acute or significant osseous findings. Review of the MIP images confirms the above findings. CT ABDOMEN and PELVIS FINDINGS Hepatobiliary: Numerous hypoattenuating hepatic masses, the largest of which measures 3.6 cm. Normal gallbladder. Pancreas: Normal contours without ductal dilatation. No peripancreatic fluid collection. Spleen: Normal. Adrenals/Urinary Tract: --Adrenal glands: Normal. --Right kidney/ureter: No hydronephrosis or perinephric stranding. No nephrolithiasis. No obstructing ureteral stones. --Left kidney/ureter: Multiple renal cyst measuring up to 28 mm. --Urinary bladder: Unremarkable. Stomach/Bowel: --Stomach/Duodenum: No hiatal hernia or other gastric abnormality. Normal duodenal course and caliber. --Small bowel: No dilatation or inflammation. --Colon: There is a anastomosis at the transverse colon. No other focal colonic abnormality is visible. --Appendix: Not visualized. No right lower quadrant inflammation or free fluid. Vascular/Lymphatic: Atherosclerotic calcification is present within the non-aneurysmal abdominal aorta, without hemodynamically significant stenosis. No abdominal or pelvic lymphadenopathy. Reproductive: Mildly enlarged prostate Musculoskeletal. No bony spinal canal stenosis or focal osseous abnormality. Other: None. IMPRESSION: 1. Bilateral pulmonary emboli with CT evidence of right heart strain (RV to LV ratio of 2.2). The emboli most likely represent a combination of new emboli and clot propagation from older emboli. 2. Innumerable bilateral pulmonary parenchymal and pleural based nodules, consistent with metastatic disease. 3. Wedge-shaped area of opacity in the lingula, likely an infarct. 4. Numerous liver metastases. 5. Aortic Atherosclerosis (ICD10-I70.0). Critical Value/emergent results were called by telephone at the time of interpretation on 10/28/2018 at 2:53 am to Dr. Shela Leff , who verbally  acknowledged these results. Electronically Signed   By: Ulyses Jarred M.D.   On: 10/28/2018 02:53   Ct Abdomen Pelvis W Contrast  Result Date: 10/28/2018 CLINICAL DATA:  Shortness of breath and gastrointestinal bleed. EXAM: CT ANGIOGRAPHY CHEST CT ABDOMEN AND PELVIS WITH CONTRAST TECHNIQUE: Multidetector CT imaging of the chest was performed using the standard  protocol during bolus administration of intravenous contrast. Multiplanar CT image reconstructions and MIPs were obtained to evaluate the vascular anatomy. Multidetector CT imaging of the abdomen and pelvis was performed using the standard protocol during bolus administration of intravenous contrast. CONTRAST:  49mL OMNIPAQUE IOHEXOL 350 MG/ML SOLN COMPARISON:  CTA chest 09/26/2018 FINDINGS: CTA CHEST FINDINGS Cardiovascular: Contrast injection is sufficient to demonstrate satisfactory opacification of the pulmonary arteries to the segmental level.There are multiple bilateral pulmonary artery filling defects. There are new filling defects that are proximal to the previously demonstrated emboli in the middle and lower lobes. Emboli in the right upper lobe appear to be new. On the left, there are new filling defects in the upper lobe and increase in the clot burden in the left lower lobe. The main pulmonary artery is within normal limits for size. There is CT evidence of right heart strain with an RV to LV ratio of 2.2 . there is mild aortic atherosclerosis there is a normal 3-vessel arch branching pattern. Heart size is mildly enlarged. No pericardial effusion. Mediastinum/Nodes: No mediastinal, hilar or axillary lymphadenopathy. The visualized thyroid and thoracic esophageal course are unremarkable. Lungs/Pleura: There are innumerable bilateral nodular opacities. There is fluid along the right major fissure. There is a wedge-shaped area of opacity in the lingula that likely indicates an infarct. There are multiple pleural-based nodules within both lungs.  Musculoskeletal: No chest wall abnormality. No acute or significant osseous findings. Review of the MIP images confirms the above findings. CT ABDOMEN and PELVIS FINDINGS Hepatobiliary: Numerous hypoattenuating hepatic masses, the largest of which measures 3.6 cm. Normal gallbladder. Pancreas: Normal contours without ductal dilatation. No peripancreatic fluid collection. Spleen: Normal. Adrenals/Urinary Tract: --Adrenal glands: Normal. --Right kidney/ureter: No hydronephrosis or perinephric stranding. No nephrolithiasis. No obstructing ureteral stones. --Left kidney/ureter: Multiple renal cyst measuring up to 28 mm. --Urinary bladder: Unremarkable. Stomach/Bowel: --Stomach/Duodenum: No hiatal hernia or other gastric abnormality. Normal duodenal course and caliber. --Small bowel: No dilatation or inflammation. --Colon: There is a anastomosis at the transverse colon. No other focal colonic abnormality is visible. --Appendix: Not visualized. No right lower quadrant inflammation or free fluid. Vascular/Lymphatic: Atherosclerotic calcification is present within the non-aneurysmal abdominal aorta, without hemodynamically significant stenosis. No abdominal or pelvic lymphadenopathy. Reproductive: Mildly enlarged prostate Musculoskeletal. No bony spinal canal stenosis or focal osseous abnormality. Other: None. IMPRESSION: 1. Bilateral pulmonary emboli with CT evidence of right heart strain (RV to LV ratio of 2.2). The emboli most likely represent a combination of new emboli and clot propagation from older emboli. 2. Innumerable bilateral pulmonary parenchymal and pleural based nodules, consistent with metastatic disease. 3. Wedge-shaped area of opacity in the lingula, likely an infarct. 4. Numerous liver metastases. 5. Aortic Atherosclerosis (ICD10-I70.0). Critical Value/emergent results were called by telephone at the time of interpretation on 10/28/2018 at 2:53 am to Dr. Shela Leff , who verbally acknowledged these  results. Electronically Signed   By: Ulyses Jarred M.D.   On: 10/28/2018 02:53   Dg Chest Port 1 View  Result Date: 11/12/2018 CLINICAL DATA:  Shortness of breath EXAM: PORTABLE CHEST 1 VIEW COMPARISON:  09/26/2018 FINDINGS: The heart size and mediastinal contours are within normal limits. Both lungs are clear. The visualized skeletal structures are unremarkable. There is a right chest wall power-injectable Port-A-Cath with tip in the lower SVC via a right internal jugular vein approach. IMPRESSION: No active disease. Electronically Signed   By: Ulyses Jarred M.D.   On: 11/07/2018 21:07   Vas [place] Lower Extremity Venous (  dvt)  Result Date: 10/29/2018  Lower Venous Study Indications: Pulmonary embolism.  Comparison Study: previous study done 09/27/18 Performing Technologist: Abram Sander RVS  Examination Guidelines: A complete evaluation includes B-mode imaging, spectral Doppler, color Doppler, and power Doppler as needed of all accessible portions of each vessel. Bilateral testing is considered an integral part of a complete examination. Limited examinations for reoccurring indications may be performed as noted.  +---------+---------------+---------+-----------+----------+--------------+ RIGHT    CompressibilityPhasicitySpontaneityPropertiesThrombus Aging +---------+---------------+---------+-----------+----------+--------------+ CFV      Full           Yes      Yes                                 +---------+---------------+---------+-----------+----------+--------------+ SFJ      Full                                                        +---------+---------------+---------+-----------+----------+--------------+ FV Prox  Full                                                        +---------+---------------+---------+-----------+----------+--------------+ FV Mid   Full                                                         +---------+---------------+---------+-----------+----------+--------------+ FV DistalFull                                                        +---------+---------------+---------+-----------+----------+--------------+ PFV      Full                                                        +---------+---------------+---------+-----------+----------+--------------+ POP      Full           Yes      Yes                                 +---------+---------------+---------+-----------+----------+--------------+ PTV      Full                                                        +---------+---------------+---------+-----------+----------+--------------+ PERO  Not visualized +---------+---------------+---------+-----------+----------+--------------+   +---------+---------------+---------+-----------+----------+--------------+ LEFT     CompressibilityPhasicitySpontaneityPropertiesThrombus Aging +---------+---------------+---------+-----------+----------+--------------+ CFV      None           No       No                                  +---------+---------------+---------+-----------+----------+--------------+ SFJ      Full                                                        +---------+---------------+---------+-----------+----------+--------------+ FV Prox  None                                                        +---------+---------------+---------+-----------+----------+--------------+ FV Mid   None                                                        +---------+---------------+---------+-----------+----------+--------------+ FV DistalNone                                                        +---------+---------------+---------+-----------+----------+--------------+ PFV      None                                                         +---------+---------------+---------+-----------+----------+--------------+ POP      None           No       No                                  +---------+---------------+---------+-----------+----------+--------------+ PTV      None                                                        +---------+---------------+---------+-----------+----------+--------------+ PERO                                                  Not visualized +---------+---------------+---------+-----------+----------+--------------+     Summary: Right: There is no evidence of deep vein thrombosis in the lower extremity. No cystic structure found in the popliteal fossa. Left: Findings consistent with acute deep vein thrombosis involving the left common femoral vein, left femoral vein, left proximal profunda  vein, left popliteal vein, and left posterior tibial veins. No cystic structure found in the popliteal fossa.  *See table(s) above for measurements and observations.    Preliminary     ROS negative except above Blood pressure 127/77, pulse 75, temperature 98.2 F (36.8 C), temperature source Oral, resp. rate 16, height 5\' 8"  (1.727 m), weight 53.9 kg, SpO2 100 %. Physical Exam vital signs stable afebrile no acute distress patient lying in bed comfortably exam pertinent for his abdomen being soft nontender BUN okay slight decreased hemoglobin CTs reviewed assessment/Plan: Seemingly lower GI bleeding and patient on blood thinners with history of colon cancer Plan: Agree with clear liquids for now consider colonoscopy if bleeding continues and use as low a dose heparin as possible and do not know if vena cava umbrella would be helpful in this case in an effort to avoid blood thinners in the future and agree with palliative care consult  Blaine E 10/29/2018, 11:59 AM

## 2018-10-29 NOTE — Progress Notes (Signed)
ANTICOAGULATION CONSULT NOTE - Follow Up Consult  Pharmacy Consult for Heparin Indication: pulmonary embolus  No Known Allergies  Patient Measurements: Height: 5\' 8"  (172.7 cm) Weight: 118 lb 13.3 oz (53.9 kg) IBW/kg (Calculated) : 68.4 Heparin Dosing Weight:   Vital Signs: Temp: 97.9 F (36.6 C) (10/07 0000) Temp Source: Oral (10/07 0000) BP: 138/83 (10/07 0256) Pulse Rate: 74 (10/07 0000)  Labs: Recent Labs    11/16/2018 2012  10/28/18 0344 10/28/18 0953 10/28/18 1330 10/28/18 1730 10/28/18 2104 10/28/18 2337  HGB 8.3*  --   --  8.9*  --  9.0*  --  9.7*  HCT 26.6*  --   --  27.8*  --  28.2*  --  30.0*  PLT 134*  --   --  126*  --   --   --   --   APTT  --   --  39*  --  154*  --   --  107*  HEPARINUNFRC  --   --  0.23*  --  0.67  --   --  0.38  CREATININE 1.47*  --   --   --   --   --   --   --   TROPONINIHS 108*   < > 277*  --  153* 138* 133*  --    < > = values in this interval not displayed.    Estimated Creatinine Clearance: 32.1 mL/min (A) (by C-G formula based on SCr of 1.47 mg/dL (H)).   Medications:  Infusions:  . heparin 800 Units/hr (10/28/18 1533)  . pantoprozole (PROTONIX) infusion 8 mg/hr (10/29/18 0200)    Assessment: Patient with heparin level at goal. PTT ordered with Heparin level until both correlate due to possible drug-lab interaction between oral anticoagulant (rivaroxaban, edoxaban, or apixaban) and anti-Xa level (aka heparin level) Will only use heparin levels going forward. No heparin issues noted.  Goal of Therapy: Reduced goal range Heparin level 0.3-0.5 units/ml Monitor platelets by anticoagulation protocol: Yes   Plan:  Continue heparin drip at current rate Recheck level at 0800  Tyler Deis, Shea Stakes [place] 10/29/2018,3:18 AM

## 2018-10-29 NOTE — Consult Note (Signed)
   [facility] CM Inpatient Consult   10/29/2018  Ordie Badgett 09/03/1940 [id number]   Patient chart has been reviewed for readmissions less than 30 days and for high risk score, 25%, for unplanned readmissions.  Patient assessed for community [facility] follow up needs.  Chart review reveals patient has no  primary PCP. Therefore, [facility] CM will not follow at this time. For questions, please contact:  Netta Cedars, MSN, [facility] Liaison Nurse Mobile Phone [phone]  Toll free office [phone]

## 2018-10-29 NOTE — Progress Notes (Signed)
Daily Progress Note   Patient Name: Scott Harper       Date: 10/29/2018 DOB: [birth year]  Age: 78 y.o. MRN#: [id number] Attending Physician: [place] Eric J, DO Primary Care Physician: Patient, No Pcp Per Admit Date: 11/05/2018  Reason for Consultation/Follow-up: Establishing goals of care  Subjective: I met today with Scott Harper and his daughter.    We reviewed his clinical course over the past 24 hours.  Discussed again about findings of worsened PE, DVT, and GI bleed while on anticoagulation.  He seems to have good understanding of his situation and reports, "sounds like there are really no good options."   He reports he would may be open to filter placement if offered, but he does understand that this would not do anything to get rid of current PE.  He and his daughter feel that we need to just wait and see how he continues to do with current interventions.   Length of Stay: 1  Current Medications: Scheduled Meds:  . Chlorhexidine Gluconate Cloth  6 each Topical Daily  . influenza vaccine adjuvanted  0.5 mL Intramuscular Tomorrow-1000  . mouth rinse  15 mL Mouth Rinse BID  . mometasone-formoterol  2 puff Inhalation BID  . pantoprazole  40 mg Oral BID    Continuous Infusions: . heparin 800 Units/hr (10/29/18 0558)    PRN Meds: acetaminophen **OR** acetaminophen  Physical Exam         General: Alert, awake, in no acute distress. Chronically ill appearing. Heart: Regular rate and rhythm. No murmur appreciated. Lungs: Good air movement, clear Abdomen: Soft, globally tender, nondistended, positive bowel sounds.  Ext: No significant edema Skin: Warm and dry Neuro: Grossly intact, nonfocal  Vital Signs: BP 127/77 (BP Location: Left Arm)   Pulse 75   Temp 98.2 F (36.8  C) (Oral)   Resp 16   Ht '5\' 8"'$  (1.727 m)   Wt 53.9 kg   SpO2 100%   BMI 18.07 kg/m  SpO2: SpO2: 100 % O2 Device: O2 Device: Room Air O2 Flow Rate: O2 Flow Rate (L/min): 3 L/min  Intake/output summary:   Intake/Output Summary (Last 24 hours) at 10/29/2018 1418 Last data filed at 10/29/2018 1223 Gross per 24 hour  Intake 1065.48 ml  Output 375 ml  Net 690.48 ml   LBM: Last BM  Date: 10/29/18 Baseline Weight: Weight: 53.9 kg Most recent weight: Weight: 53.9 kg       Palliative Assessment/Data:    Flowsheet Rows     Most Recent Value  Intake Tab  Referral Department  Hospitalist  Unit at Time of Referral  ICU  Palliative Care Primary Diagnosis  Cancer  Date Notified  10/28/18  Palliative Care Type  New Palliative care  Reason for referral  Clarify Goals of Care  Date of Admission  11/14/2018  Date first seen by Palliative Care  10/28/18  # of days Palliative referral response time  0 Day(s)  # of days IP prior to Palliative referral  1  Clinical Assessment  Palliative Performance Scale Score  40%  Psychosocial & Spiritual Assessment  Palliative Care Outcomes  Patient/Family meeting held?  Yes  Who was at the meeting?  Patient      Patient Active Problem List   Diagnosis Date Noted  . GI bleed 10/28/2018  . Pulmonary embolism ([place]) 10/28/2018  . Acute blood loss anemia 10/28/2018  . 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 Lobar pneumonia ([place])   . Metastatic cancer ([place])   . Hospice care patient   . CKD (chronic kidney disease), stage III   . Hypokalemia   . CAP (community acquired pneumonia) 09/26/2018  . ARF (acute renal failure) ([place]) 09/26/2018    Palliative Care Assessment & Plan   Patient Profile: 78 y.o. male  with past medical history of met colon cancer s/p right hemicolectomy, PE on Eliquis currently on hospice admitted on 11/22/2018 with SOB.  Found to have worsening right heart strain related to progression of subacute bilateral PE.   Course further complicated by GI bleed while on anticoagulation.  Palliative consulted for [place].   Assessment: Patient Active Problem List   Diagnosis Date Noted  . Acute deep vein thrombosis (DVT) of femoral vein of left lower extremity ([place]) 10/29/2018  . Acute lower GI bleeding 10/29/2018  . GI bleed 10/28/2018  . Pulmonary embolism ([place]) 10/28/2018  . Acute blood loss anemia 10/28/2018  . 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 Lobar pneumonia ([place])   . Metastatic cancer ([place])   . Hospice care patient   . CKD (chronic kidney disease), stage III   . Hypokalemia   . CAP (community acquired pneumonia) 09/26/2018  . ARF (acute renal failure) ([place]) 09/26/2018   Recommendations/Plan: - Patient reports understanding his situation, but he is hopeful that bleeding will stop.  Discussed that even if bleeding stops, he will still be in situation where he has PE and DVT with no good options for anticoagulation.  He would like to know more about potential risks vs benefit of filter, which he notes was mentioned as possible option.   - He reports that he and his daughter will continue to discuss this evening.  Will plan to f/u again tomorrow.  Code Status:    Code Status Orders  (From admission, onward)         Start     Ordered   10/28/18 0047  Do not attempt resuscitation (DNR)  Continuous    Question Answer Comment  In the event of cardiac or respiratory ARREST Do not call a "code blue"   In the event of cardiac or respiratory ARREST Do not perform Intubation, CPR, defibrillation or ACLS   In the event of cardiac or respiratory ARREST Use medication by any route, position, wound care, and other measures to relive  pain and suffering. May use oxygen, suction and manual treatment of airway obstruction as needed for comfort.      10/28/18 0052        Code Status History    Date Active Date Inactive Code Status Order ID Comments User Context   09/27/2018 0138 09/28/2018 1901  DNR [id number]  Rise Patience, MD Inpatient   09/27/2018 0106 09/27/2018 0138 Full Code [id number]  Rise Patience, MD Inpatient   Advance Care Planning Activity       Prognosis:   < 6 months  Discharge Planning:  Home with Hospice once medically ready  Care plan was discussed with patient, daughter  Thank you for allowing the Palliative Medicine Team to assist in the care of this patient.   Time In: 1340 Time Out: 1425 Total Time 45 Prolonged Time Billed No      Greater than 50%  of this time was spent counseling and coordinating care related to the above assessment and plan.  Micheline Rough, MD  Please contact Palliative Medicine Team phone at [phone] for questions and concerns.

## 2018-10-29 NOTE — Progress Notes (Signed)
ANTICOAGULATION CONSULT NOTE   Pharmacy Consult for Heparin Indication: pulmonary embolus  No Known Allergies  Patient Measurements: Height: 5\' 8"  (172.7 cm) Weight: 118 lb 13.3 oz (53.9 kg) IBW/kg (Calculated) : 68.4 Heparin Dosing Weight:   Vital Signs: Temp: 97.9 F (36.6 C) (10/07 0800) Temp Source: Oral (10/07 0800) BP: 146/84 (10/07 0800) Pulse Rate: 65 (10/07 0800)  Labs: Recent Labs    11/18/2018 2012  10/28/18 0344 10/28/18 0953 10/28/18 1330 10/28/18 1730 10/28/18 2104 10/28/18 2337 10/29/18 0600 10/29/18 0755  HGB 8.3*  --   --  8.9*  --  9.0*  --  9.7* 9.1*  --   HCT 26.6*  --   --  27.8*  --  28.2*  --  30.0* 27.9*  --   PLT 134*  --   --  126*  --   --   --   --  152  --   APTT  --   --  39*  --  154*  --   --  107*  --   --   HEPARINUNFRC  --    < > 0.23*  --  0.67  --   --  0.38  --  0.43  CREATININE 1.47*  --   --   --   --   --   --   --  1.36*  --   TROPONINIHS 108*   < > 277*  --  153* 138* 133*  --   --   --    < > = values in this interval not displayed.    Estimated Creatinine Clearance: 34.7 mL/min (A) (by C-G formula based on SCr of 1.36 mg/dL (H)).   Medical History: Past Medical History:  Diagnosis Date  . Cancer [facility])    colon     Medications:  Medications Prior to Admission  Medication Sig Dispense Refill Last Dose  . apixaban (ELIQUIS) 5 MG TABS tablet Take 1 tablet (5 mg total) by mouth 2 (two) times daily. 60 tablet 0 10/30/2018 at Unknown time  . budesonide-formoterol (SYMBICORT) 80-4.5 MCG/ACT inhaler Inhale 2 puffs into the lungs 2 (two) times daily.   10/26/2018 at Unknown time  . dexamethasone (DECADRON) 4 MG tablet Take 4 mg by mouth See admin instructions. Take 2 days after chemo   Past Month at Unknown time  . gabapentin (NEURONTIN) 100 MG capsule Take 100 mg by mouth 3 (three) times daily.   10/26/2018 at Unknown time  . morphine (MS CONTIN) 100 MG 12 hr tablet Take 100 mg by mouth every 12 (twelve) hours.   10/26/2018 at  Unknown time  . omeprazole (PRILOSEC) 20 MG capsule Take 20 mg by mouth daily.   10/26/2018 at Unknown time  . oxyCODONE (OXY IR/ROXICODONE) 5 MG immediate release tablet Take 5 mg by mouth every 4 (four) hours as needed for pain.   11/17/2018 at Unknown time  . potassium chloride 20 MEQ TBCR Take 20 mEq by mouth 2 (two) times daily. 10 tablet 0 11/05/2018 at Unknown time  . potassium chloride SA (KLOR-CON M20) 20 MEQ tablet Take 20 mEq by mouth daily.   10/26/2018 at Unknown time  . prochlorperazine (COMPAZINE) 10 MG tablet Take 10 mg by mouth every 6 (six) hours as needed for nausea/vomiting.   10/26/2018 at Unknown time  . promethazine (PHENERGAN) 25 MG tablet Take 25 mg by mouth every 6 (six) hours as needed for nausea/vomiting.   10/26/2018 at Unknown time  . mirtazapine (REMERON)  30 MG tablet Take 30 mg by mouth at bedtime.      Scheduled:  . Chlorhexidine Gluconate Cloth  6 each Topical Daily  . influenza vaccine adjuvanted  0.5 mL Intramuscular Tomorrow-1000  . mouth rinse  15 mL Mouth Rinse BID  . mometasone-formoterol  2 puff Inhalation BID   Infusions:  . heparin 800 Units/hr (10/29/18 0558)  . pantoprozole (PROTONIX) infusion 8 mg/hr (10/29/18 0600)    Assessment: Patient on apixaban prior to admission for PE.  MD determined patient has failed apixaban and desires for heparin without bolus per pharmacy to begin.  MD also would like reduce goal range for levels (0.3 -0.5 heparin level or 66-85 secs)  Baseline coags ordered. LD apixaban 10/5  10/29/2018 HL 0.43, therapeutic Hgb 9.1, Plts 152 Per RN pt not bleeding any more than when he came in  Goal of Therapy:  (reduced goal ranges) Heparin level 0.3-0.5 units/ml  aPTT 66-85 seconds Monitor platelets by anticoagulation protocol: Yes   Plan:  continue Heparin drip at 800 units/hr Daily CBC and HL  Dolly Rias RPh 10/29/2018, 8:58 AM Pager [phone]

## 2018-10-29 NOTE — Progress Notes (Signed)
Lower extremity venous has been completed.   Preliminary results in CV Proc.   Abram Sander 10/29/2018 9:09 AM

## 2018-10-29 NOTE — Progress Notes (Signed)
Report given to [place], RN on 4W and all questions answered.  Patient will notify daughter of room change as she is not answering her phone right now.

## 2018-10-30 ENCOUNTER — Encounter (HOSPITAL_COMMUNITY): Payer: Self-pay | Admitting: Interventional Radiology

## 2018-10-30 ENCOUNTER — Inpatient Hospital Stay (HOSPITAL_COMMUNITY): Payer: Medicare Other

## 2018-10-30 HISTORY — PX: IR IVC FILTER PLMT / S&I /IMG GUID/MOD SED: IMG701

## 2018-10-30 LAB — PROTIME-INR
INR: 1.1 (ref 0.8–1.2)
Prothrombin Time: 13.7 seconds (ref 11.4–15.2)

## 2018-10-30 LAB — BASIC METABOLIC PANEL
Anion gap: 10 (ref 5–15)
BUN: 10 mg/dL (ref 8–23)
CO2: 19 mmol/L — ABNORMAL LOW (ref 22–32)
Calcium: 8.3 mg/dL — ABNORMAL LOW (ref 8.9–10.3)
Chloride: 112 mmol/L — ABNORMAL HIGH (ref 98–111)
Creatinine, Ser: 1.35 mg/dL — ABNORMAL HIGH (ref 0.61–1.24)
GFR calc Af Amer: 58 mL/min — ABNORMAL LOW (ref >60)
GFR calc non Af Amer: 50 mL/min — ABNORMAL LOW (ref >60)
Glucose, Bld: 97 mg/dL (ref 70–99)
Potassium: 3.7 mmol/L (ref 3.5–5.1)
Sodium: 141 mmol/L (ref 135–145)

## 2018-10-30 LAB — CBC
HCT: 27.6 % — ABNORMAL LOW (ref 39.0–52.0)
Hemoglobin: 8.8 g/dL — ABNORMAL LOW (ref 13.0–17.0)
MCH: 28.9 pg (ref 26.0–34.0)
MCHC: 31.9 g/dL (ref 30.0–36.0)
MCV: 90.8 fL (ref 80.0–100.0)
Platelets: 153 10*3/uL (ref 150–400)
RBC: 3.04 MIL/uL — ABNORMAL LOW (ref 4.22–5.81)
RDW: 15.4 % (ref 11.5–15.5)
WBC: 8.7 10*3/uL (ref 4.0–10.5)
nRBC: 0 % (ref 0.0–0.2)

## 2018-10-30 LAB — HEPARIN LEVEL (UNFRACTIONATED): Heparin Unfractionated: 0.33 IU/mL (ref 0.30–0.70)

## 2018-10-30 MED ORDER — IOHEXOL 300 MG/ML  SOLN
50.0000 mL | Freq: Once | INTRAMUSCULAR | Status: AC | PRN
  Administered 2018-10-30: 10 mL via INTRAVENOUS

## 2018-10-30 MED ORDER — LIDOCAINE HCL (PF) 1 % IJ SOLN
INTRAMUSCULAR | Status: AC | PRN
  Administered 2018-10-30: 5 mL

## 2018-10-30 MED ORDER — FENTANYL CITRATE (PF) 100 MCG/2ML IJ SOLN
INTRAMUSCULAR | Status: AC
  Filled 2018-10-30: qty 2

## 2018-10-30 MED ORDER — IOHEXOL 300 MG/ML  SOLN
100.0000 mL | Freq: Once | INTRAMUSCULAR | Status: AC | PRN
  Administered 2018-10-30: 45 mL via INTRAVENOUS

## 2018-10-30 MED ORDER — MIDAZOLAM HCL 2 MG/2ML IJ SOLN
INTRAMUSCULAR | Status: AC | PRN
  Administered 2018-10-30: 1 mg via INTRAVENOUS
  Administered 2018-10-30 (×2): 0.5 mg via INTRAVENOUS

## 2018-10-30 MED ORDER — MIDAZOLAM HCL 2 MG/2ML IJ SOLN
INTRAMUSCULAR | Status: AC
  Filled 2018-10-30: qty 2

## 2018-10-30 MED ORDER — FENTANYL CITRATE (PF) 100 MCG/2ML IJ SOLN
INTRAMUSCULAR | Status: AC | PRN
  Administered 2018-10-30 (×2): 50 ug via INTRAVENOUS

## 2018-10-30 NOTE — Progress Notes (Signed)
OT Cancellation Note  Patient Details Name: Scott Harper MRN: [id number] DOB: 1940-11-20   Cancelled Treatment:    Reason Eval/Treat Not Completed: Other (comment). Pt is getting an IVC filter today. He was independent with BADLs but is not sure he still is. Will check back tomorrow.  Scott Harper 10/30/2018, 3:09 PM  Scott Harper, OTR/L Acute Rehabilitation Services [phone] [facility] pager [phone] office 10/30/2018

## 2018-10-30 NOTE — Procedures (Signed)
Interventional Radiology Procedure Note  Procedure: Placement of infrarenal IVC filter.  Due to patient's underlying comorbidities, this will be considered a permanent device and will not be followed for retrieval.   Complications: None  Estimated Blood Loss: None  Recommendations: - Bedrest x 2 hrs with HOB elevated   Signed,  Criselda Peaches, MD

## 2018-10-30 NOTE — Progress Notes (Signed)
Patient off floor for procedure.  Met briefly with daughter in his room.  Will plan to check in tomorrow.  NO CHARGE NOTE  Micheline Rough, MD [place] Team [phone]

## 2018-10-30 NOTE — Progress Notes (Signed)
ANTICOAGULATION CONSULT NOTE   Pharmacy Consult for Heparin Indication: pulmonary embolus  No Known Allergies  Patient Measurements: Height: 5\' 8"  (172.7 cm) Weight: 118 lb 13.3 oz (53.9 kg) IBW/kg (Calculated) : 68.4 Heparin Dosing Weight:   Vital Signs: Temp: 98.6 F (37 C) (10/08 0551) Temp Source: Oral (10/08 0551) BP: 135/82 (10/08 0551) Pulse Rate: 69 (10/08 0551)  Labs: Recent Labs    10/29/2018 2012  10/28/18 0344 10/28/18 0953 10/28/18 1330 10/28/18 1730 10/28/18 2104 10/28/18 2337 10/29/18 0600 10/29/18 0755 10/30/18 0334  HGB 8.3*  --   --  8.9*  --  9.0*  --  9.7* 9.1*  --  8.8*  HCT 26.6*  --   --  27.8*  --  28.2*  --  30.0* 27.9*  --  27.6*  PLT 134*  --   --  126*  --   --   --   --  152  --  153  APTT  --   --  39*  --  154*  --   --  107*  --   --   --   HEPARINUNFRC  --    < > 0.23*  --  0.67  --   --  0.38  --  0.43 0.33  CREATININE 1.47*  --   --   --   --   --   --   --  1.36*  --  1.35*  TROPONINIHS 108*   < > 277*  --  153* 138* 133*  --   --   --   --    < > = values in this interval not displayed.    Estimated Creatinine Clearance: 34.9 mL/min (A) (by C-G formula based on SCr of 1.35 mg/dL (H)).  Assessment: Patient on apixaban prior to admission for PE.  MD determined patient has failed apixaban and desires for heparin without bolus per pharmacy to begin.  MD also would like reduce goal range for levels (0.3 -0.5 heparin level or 66-85 secs)  Baseline coags ordered. LD apixaban 10/5  10/30/2018 HL 0.33 therapeutic Hgb 8.8, Plts 153 No new reports of increased bleeding  yesterday RN pt not bleeding any more than when he came in Considering IVC filter placement  Goal of Therapy:  (reduced goal ranges) Heparin level 0.3-0.5 units/ml  aPTT 66-85 seconds Monitor platelets by anticoagulation protocol: Yes   Plan:  continue Heparin drip at 800 units/hr Daily CBC and HL F/u DC plans: rec LMWH for pt w/ cancer  Eudelia Bunch,  Pharm.D [phone] 10/30/2018 9:21 AM

## 2018-10-30 NOTE — Consult Note (Signed)
Chief Complaint: Patient was seen in consultation today for IVC filter placement Chief Complaint  Patient presents with   Shortness of Breath    Referring Physician(s): Austria,E  Supervising Physician: Jacqulynn Cadet  Patient Status: [facility] - In-pt  History of Present Illness: Scott Harper is a 78 y.o. male with history of metastatic colon cancer and prior right hemicolectomy, currently on hospice.  He was recently admitted to [facility] with shortness of breath and found to have acute bilateral PE/possible pneumonia versus pulmonary infarct and started on Eliquis.  He has also had recent GI bleed.  Latest CT chest on 10/6 revealed bilateral pulmonary emboli with evidence of right heart strain, innumerable bilateral pulmonary parenchymal and pleural-based nodules consistent with metastatic disease, wedge-shaped opacity of lingula/likely infarct, numerous liver metastases.  Lower extremity venous Doppler study yesterday revealed acute DVT involving left common femoral, left femoral vein, left proximal profunda, left popliteal and left posterior tibial veins.  He is currently on IV heparin.  Request now received for IVC filter placement.  Past Medical History:  Diagnosis Date   Cancer [facility])    colon     Past Surgical History:  Procedure Laterality Date   COLON SURGERY  2018   TONSILLECTOMY      Allergies: Patient has no known allergies.  Medications: Prior to Admission medications   Medication Sig Start Date End Date Taking? Authorizing Provider  apixaban (ELIQUIS) 5 MG TABS tablet Take 1 tablet (5 mg total) by mouth 2 (two) times daily. 10/05/18  Yes Florencia Reasons, MD  budesonide-formoterol [facility]) 80-4.5 MCG/ACT inhaler Inhale 2 puffs into the lungs 2 (two) times daily. 11/22/17  Yes [provider]  dexamethasone (DECADRON) 4 MG tablet Take 4 mg by mouth See admin instructions. Take 2 days after chemo 11/22/17  Yes [provider]    gabapentin (NEURONTIN) 100 MG capsule Take 100 mg by mouth 3 (three) times daily. 10/14/18  Yes [provider]  morphine (MS CONTIN) 100 MG 12 hr tablet Take 100 mg by mouth every 12 (twelve) hours.   Yes [provider]  omeprazole (PRILOSEC) 20 MG capsule Take 20 mg by mouth daily. 02/14/18 02/14/19 Yes [provider]  oxyCODONE (OXY IR/ROXICODONE) 5 MG immediate release tablet Take 5 mg by mouth every 4 (four) hours as needed for pain. 03/21/18  Yes [provider]  potassium chloride 20 MEQ TBCR Take 20 mEq by mouth 2 (two) times daily. 05/28/17  Yes Davonna Belling, MD  potassium chloride SA (KLOR-CON M20) 20 MEQ tablet Take 20 mEq by mouth daily. 06/18/18  Yes [provider]  prochlorperazine (COMPAZINE) 10 MG tablet Take 10 mg by mouth every 6 (six) hours as needed for nausea/vomiting. 11/22/17 11/22/18 Yes [provider]  promethazine (PHENERGAN) 25 MG tablet Take 25 mg by mouth every 6 (six) hours as needed for nausea/vomiting. 11/22/17  Yes [provider]  mirtazapine (REMERON) 30 MG tablet Take 30 mg by mouth at bedtime.    [provider]     Family History  Family history unknown: Yes    Social History   Socioeconomic History   Marital status: Widowed    Spouse name: Not on file   Number of children: Not on file   Years of education: Not on file   Highest education level: Not on file  Occupational History   Not on file  Social Needs   Financial resource strain: Somewhat hard   Food insecurity  Worry: Sometimes true    Inability: Sometimes true   Transportation needs    Medical: No    Non-medical: No  Tobacco Use   Smoking status: Current Every Day Smoker    Types: Cigarettes   Smokeless tobacco: Never Used  Substance and Sexual Activity   Alcohol use: Yes    Comment: ONCE IN A WHILE   Drug use: Yes    Types: Oxycodone, Morphine    Comment: PRESCRIBED MEDS   Sexual activity:  Not Currently  Lifestyle   Physical activity    Days per week: 0 days    Minutes per session: 0 min   Stress: To some extent  Relationships   Social connections    Talks on phone: More than three times a week    Gets together: Once a week    Attends religious service: More than 4 times per year    Active member of club or organization: No    Attends meetings of clubs or organizations: Never    Relationship status: Widowed  Other Topics Concern   Not on file  Social History Narrative   Not on file      Review of Systems currently denies fever, headache, chest pain, cough, abdominal/back pain, nausea, vomiting or bleeding.  He does have dyspnea with exertion.  Vital Signs: BP 135/82 (BP Location: Left Arm)    Pulse 69    Temp 98.6 F (37 C) (Oral)    Resp 18    Ht 5\' 8"  (1.727 m)    Wt 118 lb 13.3 oz (53.9 kg)    SpO2 98%    BMI 18.07 kg/m   Physical Exam awake, alert.  Chest clear to auscultation bilaterally.  Heart with regular rate and rhythm.  Abdomen soft, positive bowel sounds, currently nontender.  No significant lower extremity edema.  Imaging: Ct Angio Chest Pe W Or Wo Contrast  Result Date: 10/28/2018 CLINICAL DATA:  Shortness of breath and gastrointestinal bleed. EXAM: CT ANGIOGRAPHY CHEST CT ABDOMEN AND PELVIS WITH CONTRAST TECHNIQUE: Multidetector CT imaging of the chest was performed using the standard protocol during bolus administration of intravenous contrast. Multiplanar CT image reconstructions and MIPs were obtained to evaluate the vascular anatomy. Multidetector CT imaging of the abdomen and pelvis was performed using the standard protocol during bolus administration of intravenous contrast. CONTRAST:  69mL OMNIPAQUE IOHEXOL 350 MG/ML SOLN COMPARISON:  CTA chest 09/26/2018 FINDINGS: CTA CHEST FINDINGS Cardiovascular: Contrast injection is sufficient to demonstrate satisfactory opacification of the pulmonary arteries to the segmental level.There are multiple  bilateral pulmonary artery filling defects. There are new filling defects that are proximal to the previously demonstrated emboli in the middle and lower lobes. Emboli in the right upper lobe appear to be new. On the left, there are new filling defects in the upper lobe and increase in the clot burden in the left lower lobe. The main pulmonary artery is within normal limits for size. There is CT evidence of right heart strain with an RV to LV ratio of 2.2 . there is mild aortic atherosclerosis there is a normal 3-vessel arch branching pattern. Heart size is mildly enlarged. No pericardial effusion. Mediastinum/Nodes: No mediastinal, hilar or axillary lymphadenopathy. The visualized thyroid and thoracic esophageal course are unremarkable. Lungs/Pleura: There are innumerable bilateral nodular opacities. There is fluid along the right major fissure. There is a wedge-shaped area of opacity in the lingula that likely indicates an infarct. There are multiple pleural-based nodules within both lungs. Musculoskeletal: No chest wall  abnormality. No acute or significant osseous findings. Review of the MIP images confirms the above findings. CT ABDOMEN and PELVIS FINDINGS Hepatobiliary: Numerous hypoattenuating hepatic masses, the largest of which measures 3.6 cm. Normal gallbladder. Pancreas: Normal contours without ductal dilatation. No peripancreatic fluid collection. Spleen: Normal. Adrenals/Urinary Tract: --Adrenal glands: Normal. --Right kidney/ureter: No hydronephrosis or perinephric stranding. No nephrolithiasis. No obstructing ureteral stones. --Left kidney/ureter: Multiple renal cyst measuring up to 28 mm. --Urinary bladder: Unremarkable. Stomach/Bowel: --Stomach/Duodenum: No hiatal hernia or other gastric abnormality. Normal duodenal course and caliber. --Small bowel: No dilatation or inflammation. --Colon: There is a anastomosis at the transverse colon. No other focal colonic abnormality is visible. --Appendix: Not  visualized. No right lower quadrant inflammation or free fluid. Vascular/Lymphatic: Atherosclerotic calcification is present within the non-aneurysmal abdominal aorta, without hemodynamically significant stenosis. No abdominal or pelvic lymphadenopathy. Reproductive: Mildly enlarged prostate Musculoskeletal. No bony spinal canal stenosis or focal osseous abnormality. Other: None. IMPRESSION: 1. Bilateral pulmonary emboli with CT evidence of right heart strain (RV to LV ratio of 2.2). The emboli most likely represent a combination of new emboli and clot propagation from older emboli. 2. Innumerable bilateral pulmonary parenchymal and pleural based nodules, consistent with metastatic disease. 3. Wedge-shaped area of opacity in the lingula, likely an infarct. 4. Numerous liver metastases. 5. Aortic Atherosclerosis (ICD10-I70.0). Critical Value/emergent results were called by telephone at the time of interpretation on 10/28/2018 at 2:53 am to Dr. Shela Leff , who verbally acknowledged these results. Electronically Signed   By: Ulyses Jarred M.D.   On: 10/28/2018 02:53   Ct Abdomen Pelvis W Contrast  Result Date: 10/28/2018 CLINICAL DATA:  Shortness of breath and gastrointestinal bleed. EXAM: CT ANGIOGRAPHY CHEST CT ABDOMEN AND PELVIS WITH CONTRAST TECHNIQUE: Multidetector CT imaging of the chest was performed using the standard protocol during bolus administration of intravenous contrast. Multiplanar CT image reconstructions and MIPs were obtained to evaluate the vascular anatomy. Multidetector CT imaging of the abdomen and pelvis was performed using the standard protocol during bolus administration of intravenous contrast. CONTRAST:  43mL OMNIPAQUE IOHEXOL 350 MG/ML SOLN COMPARISON:  CTA chest 09/26/2018 FINDINGS: CTA CHEST FINDINGS Cardiovascular: Contrast injection is sufficient to demonstrate satisfactory opacification of the pulmonary arteries to the segmental level.There are multiple bilateral pulmonary  artery filling defects. There are new filling defects that are proximal to the previously demonstrated emboli in the middle and lower lobes. Emboli in the right upper lobe appear to be new. On the left, there are new filling defects in the upper lobe and increase in the clot burden in the left lower lobe. The main pulmonary artery is within normal limits for size. There is CT evidence of right heart strain with an RV to LV ratio of 2.2 . there is mild aortic atherosclerosis there is a normal 3-vessel arch branching pattern. Heart size is mildly enlarged. No pericardial effusion. Mediastinum/Nodes: No mediastinal, hilar or axillary lymphadenopathy. The visualized thyroid and thoracic esophageal course are unremarkable. Lungs/Pleura: There are innumerable bilateral nodular opacities. There is fluid along the right major fissure. There is a wedge-shaped area of opacity in the lingula that likely indicates an infarct. There are multiple pleural-based nodules within both lungs. Musculoskeletal: No chest wall abnormality. No acute or significant osseous findings. Review of the MIP images confirms the above findings. CT ABDOMEN and PELVIS FINDINGS Hepatobiliary: Numerous hypoattenuating hepatic masses, the largest of which measures 3.6 cm. Normal gallbladder. Pancreas: Normal contours without ductal dilatation. No peripancreatic fluid collection. Spleen: Normal. Adrenals/Urinary Tract: --Adrenal  glands: Normal. --Right kidney/ureter: No hydronephrosis or perinephric stranding. No nephrolithiasis. No obstructing ureteral stones. --Left kidney/ureter: Multiple renal cyst measuring up to 28 mm. --Urinary bladder: Unremarkable. Stomach/Bowel: --Stomach/Duodenum: No hiatal hernia or other gastric abnormality. Normal duodenal course and caliber. --Small bowel: No dilatation or inflammation. --Colon: There is a anastomosis at the transverse colon. No other focal colonic abnormality is visible. --Appendix: Not visualized. No right  lower quadrant inflammation or free fluid. Vascular/Lymphatic: Atherosclerotic calcification is present within the non-aneurysmal abdominal aorta, without hemodynamically significant stenosis. No abdominal or pelvic lymphadenopathy. Reproductive: Mildly enlarged prostate Musculoskeletal. No bony spinal canal stenosis or focal osseous abnormality. Other: None. IMPRESSION: 1. Bilateral pulmonary emboli with CT evidence of right heart strain (RV to LV ratio of 2.2). The emboli most likely represent a combination of new emboli and clot propagation from older emboli. 2. Innumerable bilateral pulmonary parenchymal and pleural based nodules, consistent with metastatic disease. 3. Wedge-shaped area of opacity in the lingula, likely an infarct. 4. Numerous liver metastases. 5. Aortic Atherosclerosis (ICD10-I70.0). Critical Value/emergent results were called by telephone at the time of interpretation on 10/28/2018 at 2:53 am to Dr. Shela Leff , who verbally acknowledged these results. Electronically Signed   By: Ulyses Jarred M.D.   On: 10/28/2018 02:53   Dg Chest Port 1 View  Result Date: 11/11/2018 CLINICAL DATA:  Shortness of breath EXAM: PORTABLE CHEST 1 VIEW COMPARISON:  09/26/2018 FINDINGS: The heart size and mediastinal contours are within normal limits. Both lungs are clear. The visualized skeletal structures are unremarkable. There is a right chest wall power-injectable Port-A-Cath with tip in the lower SVC via a right internal jugular vein approach. IMPRESSION: No active disease. Electronically Signed   By: Ulyses Jarred M.D.   On: 11/07/2018 21:07   Vas [place] Lower Extremity Venous (dvt)  Result Date: 10/29/2018  Lower Venous Study Indications: Pulmonary embolism.  Comparison Study: previous study done 09/27/18 Performing Technologist: Abram Sander RVS  Examination Guidelines: A complete evaluation includes B-mode imaging, spectral Doppler, color Doppler, and power Doppler as needed of all accessible  portions of each vessel. Bilateral testing is considered an integral part of a complete examination. Limited examinations for reoccurring indications may be performed as noted.  +---------+---------------+---------+-----------+----------+--------------+  RIGHT     Compressibility Phasicity Spontaneity Properties Thrombus Aging  +---------+---------------+---------+-----------+----------+--------------+  CFV       Full            Yes       Yes                                    +---------+---------------+---------+-----------+----------+--------------+  SFJ       Full                                                             +---------+---------------+---------+-----------+----------+--------------+  FV Prox   Full                                                             +---------+---------------+---------+-----------+----------+--------------+  FV  Mid    Full                                                             +---------+---------------+---------+-----------+----------+--------------+  FV Distal Full                                                             +---------+---------------+---------+-----------+----------+--------------+  PFV       Full                                                             +---------+---------------+---------+-----------+----------+--------------+  POP       Full            Yes       Yes                                    +---------+---------------+---------+-----------+----------+--------------+  PTV       Full                                                             +---------+---------------+---------+-----------+----------+--------------+  PERO                                                       Not visualized  +---------+---------------+---------+-----------+----------+--------------+   +---------+---------------+---------+-----------+----------+--------------+  LEFT      Compressibility Phasicity Spontaneity Properties Thrombus Aging   +---------+---------------+---------+-----------+----------+--------------+  CFV       None            No        No                                     +---------+---------------+---------+-----------+----------+--------------+  SFJ       Full                                                             +---------+---------------+---------+-----------+----------+--------------+  FV Prox   None                                                             +---------+---------------+---------+-----------+----------+--------------+  FV Mid    None                                                             +---------+---------------+---------+-----------+----------+--------------+  FV Distal None                                                             +---------+---------------+---------+-----------+----------+--------------+  PFV       None                                                             +---------+---------------+---------+-----------+----------+--------------+  POP       None            No        No                                     +---------+---------------+---------+-----------+----------+--------------+  PTV       None                                                             +---------+---------------+---------+-----------+----------+--------------+  PERO                                                       Not visualized  +---------+---------------+---------+-----------+----------+--------------+     Summary: Right: There is no evidence of deep vein thrombosis in the lower extremity. No cystic structure found in the popliteal fossa. Left: Findings consistent with acute deep vein thrombosis involving the left common femoral vein, left femoral vein, left proximal profunda vein, left popliteal vein, and left posterior tibial veins. No cystic structure found in the popliteal fossa.  *See table(s) above for measurements and observations. Electronically signed by Curt Jews MD on 10/29/2018 at  4:39:56 PM.    Final     Labs:  CBC: Recent Labs    10/26/2018 2012 10/28/18 [id number] 10/28/18 1730 10/28/18 2337 10/29/18 0600 10/30/18 0334  WBC 7.4 7.6  --   --  9.2 8.7  HGB 8.3* 8.9* 9.0* 9.7* 9.1* 8.8*  HCT 26.6* 27.8* 28.2* 30.0* 27.9* 27.6*  PLT 134* 126*  --   --  152 153    COAGS: Recent Labs    09/27/18 0135 10/28/18 0344 10/28/18 1330 10/28/18 2337  INR 1.3*  --   --   --   APTT 182* 39* 154* 107*    BMP: Recent Labs    09/28/18 0356 11/22/2018 2012 10/29/18 0600 10/30/18  0334  NA 142 139 143 141  K 4.2 3.5 3.8 3.7  CL 106 107 112* 112*  CO2 22 22 19* 19*  GLUCOSE 98 106* 95 97  BUN 19 12 14 10   CALCIUM 9.0 8.6* 8.4* 8.3*  CREATININE 1.46* 1.47* 1.36* 1.35*  GFRNONAA 46* 45* 50* 50*  GFRAA 53* 53* 58* 58*    LIVER FUNCTION TESTS: Recent Labs    09/26/18 1738 09/27/18 0135 10/23/2018 2012 10/29/18 0600  BILITOT 1.9* 1.4* 0.9 0.8  AST 43* 37 64* 42*  ALT 33 30 34 26  ALKPHOS 139* 127* 203* 167*  PROT 7.0 6.6 5.7* 4.9*  ALBUMIN 3.7 3.5 3.0* 2.8*    TUMOR MARKERS: No results for input(s): AFPTM, CEA, CA199, CHROMGRNA in the last 8760 hours.  Assessment and Plan:  78 y.o. male with history of metastatic colon cancer and prior right hemicolectomy, currently on hospice.  He was recently admitted to [facility] with shortness of breath and found to have acute bilateral PE/possible pneumonia versus pulmonary infarct and started on Eliquis.  He has also had recent GI bleed.  Latest CT chest on 10/6 revealed bilateral pulmonary emboli with evidence of right heart strain, innumerable bilateral pulmonary parenchymal and pleural-based nodules consistent with metastatic disease, wedge-shaped opacity of lingula/likely infarct, numerous liver metastases.  Lower extremity venous Doppler study yesterday revealed acute DVT involving left common femoral, left femoral vein, left proximal profunda, left popliteal and left posterior tibial veins.  He is  currently on IV heparin.  Request now received for IVC filter placement.  Case/imaging studies have been reviewed by Dr. Laurence Ferrari.Risks and benefits discussed with the patient including, but not limited to bleeding, infection, contrast induced renal failure, filter fracture or migration which can lead to emergency surgery or even death, strut penetration with damage or irritation to adjacent structures and caval thrombosis.  Current creatinine 1.35.  All of the patient's questions were answered, patient is agreeable to proceed. Consent signed and in chart.  Procedure scheduled for later today    Thank you for this interesting consult.  I greatly enjoyed meeting Scott Harper and look forward to participating in their care.  A copy of this report was sent to the requesting provider on this date.  Electronically Signed: D. Rowe Robert, PA-C 10/30/2018, 11:38 AM   I spent a total of 25 minutes  in face to face in clinical consultation, greater than 50% of which was counseling/coordinating care for IVC filter placement

## 2018-10-30 NOTE — Progress Notes (Signed)
PROGRESS NOTE    [name]  [id number] DOB: January 23, 1940 DOA: 10/29/2018 PCP: Patient, No Pcp Per   Brief Narrative:   Scott Harper is a 78 y.o. 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  Patient was admitted to the hospital last month for acute bilateral PE and possible pneumonia versus pulmonary infarct.  He was started on Eliquis.  Patient reports having shortness of breath for the past few weeks.  Denies cough.  States he has noticed blood in his stool since he started taking Eliquis.  He has not vomited any blood.  He has also been having epigastric abdominal pain.  Denies history of prior GI bleed.  States he stopped taking Eliquis 4 to 5 days ago as he was noticing blood in his stool.  No additional history could be obtained from him.  ED Course: Not hypoxic, placed on nonrebreather by EMS for comfort.  Blood pressure soft.  Slightly tachypneic.  Hemoglobin 8.3, was 11.3 on 09/28/2018.  BNP normal.  High-sensitivity troponin 108.  EKG not suggestive of ACS.  FOBT positive.  SARS-CoV-2 test pending. Chest x-ray showing no active disease. Patient received 1 L normal saline bolus.  1 unit PRBCs ordered.    Assessment & Plan:   Principal Problem:   Pulmonary embolism (HCC) Active Problems:   Metastatic cancer [facility])   Hospice care patient   GI bleed   Acute blood loss anemia   Metastatic colon cancer to liver [facility])   Acute deep vein thrombosis (DVT) of femoral vein of left lower extremity (HCC)   Acute lower GI bleeding   Subacute bilateral pulmonary embolism with evidence of worsening right heart strain Pulmonary infarct Elevated troponin, likely demand ischemia from right heart strain secondary to PE Recently discharged on 09/26/2018 for acute bilateral PE in the setting of  metastatic cancer, and discharged on Eliquis.  He has failed Eliquis as repeat CT angiogram showing bilateral PE and evidence of worsening right heart strain (RV to LV ratio of 2.2).  There is evidence of new emboli and clot propagation from older emboli.  Wedge-shaped area of opacity in the lingula, likely an infarct. High-sensitivity troponin elevated in the setting of right heart strain (108 > 197 > 277).  EKG without acute ischemic changes.  Difficult situation as patient has also been having intermittent hematochezia since he was started on Eliquis and hemoglobin with 3 g drop compared to labs done during hospitalization a month ago.  FOBT positive and stool mixed with blood on exam done by ED provider.  Case was discussed with Dr. Ronnette Juniper from PCCM and Dr. Laurence Ferrari with IR by admitting provider; and was deemed patient not a candidate for thrombolytics given active GI bleed.  The only option at this point is trying unfractionated heparin at a low dose and monitoring closely for worsening GI bleed.    Echocardiogram with EF [id number], diastolic dysfunction, RV pressure overload, mild RV reduced function, positive McConnell sign, RA moderately dilated, mild TR, normal IVC.   --Continue IV heparin infusion --Continue to monitor hemoglobin closely while on heparin drip with GI bleed as below  Acute blood loss anemia secondary to acute GI bleed, likely lower Patient presenting with shortness of breath.  Hemoglobin on admission noted to be 8.3, which is a 3 g drop since 09/28/2018 with hemoglobin 11.3.  Recently  started on Eliquis for PE as above.  FOBT positive and stool mixed with blood on exam during admission. Etiology likely secondary to anticoagulation and further complicated by his history of metastatic colorectal cancer, presum lower GIB at this point as he initially stated some of bright red blood in his stool. --[facility] consulted, appreciate assistance --s/p 1u pRBC on 10/6 --Hgb  8.3-->8.9-->9.0-->9.7-->9.1-->8.8; currently appears has stabilized --PPI drip transition to Protonix 40 mg p.o. twice daily 10/7 --NPO for IVC filter today --If bleeding picks up or persists, GI possibly considering colonoscopy  Acute left lower extremity DVT Lower externally venous duplex scan notable for acute DVT in the left common femoral,, left femoral, left popliteal, left posterior tibial veins.  Of which this is a new finding since last venous duplex scan back in early September 2020.  This is another confirmation that he has failed outpatient Eliquis therapy. 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  Patient is followed by [facility], Dr. Estelle Grumbles.  Last seen in office on 07/18/2018 with repeat CT scan at that time showing new and a large liver lesions with recommendations of hospice. --Palliative care following, appreciate assistance   DVT prophylaxis: SCDs, heparin drip Code Status: DNR Family Communication: Discussed with patient's daughter via telephone Disposition Plan: Continue inpatient, IVC filter placement today, will resume heparin drip following hemoglobin seems to have stabilized, will likely attempt to transition heparin drip to Coumadin versus Lovenox, ultimately still a very poor/grim prognosis  Consultants:   Case was discussed by admitting provider with interventional radiology and PCCM.  [facility]  Interventional radiology  Procedures:   none  Antimicrobials:   none   Subjective: Patient seen and examined bedside, resting comfortably.  Feels hungry.  Awaiting IR placement of IVC filter today.  No other complaints.  States had one small bowel movement late  last night with some "speckled" blood. Denies headache, no fever/chills/night sweats, no chest pain, no shortness of breath, palpitations, no cough/congestion.  No acute events overnight per nursing staff.  Objective: Vitals:   10/30/18 0807 10/30/18 0809 10/30/18 1236 10/30/18 1300  BP:   126/73   Pulse:   75   Resp:   16   Temp:   98.4 F (36.9 C)   TempSrc:   Oral   SpO2: 98% 98% 100% 98%  Weight:      Height:        Intake/Output Summary (Last 24 hours) at 10/30/2018 1341 Last data filed at 10/30/2018 0631 Gross per 24 hour  Intake 244.13 ml  Output 600 ml  Net -355.87 ml   Filed Weights   10/28/18 0413  Weight: 53.9 kg    Examination:  General exam: Appears calm and comfortable, thin and cachectic in appearance Respiratory system: Clear to auscultation. Respiratory effort normal.  Oxygenating well on room air Cardiovascular system: S1 & S2 heard, RRR. No JVD, murmurs, rubs, gallops or clicks. No pedal edema. Gastrointestinal system: Abdomen is nondistended, soft and nontender. No organomegaly or masses felt. Normal bowel sounds heard. Central nervous system: Alert and oriented. No focal neurological deficits. Extremities: Symmetric 5 x 5 power. Skin: No rashes, lesions or ulcers Psychiatry: Judgement and insight appear normal. Mood & affect appropriate.     Data  Reviewed: I have personally reviewed following labs and imaging studies  CBC: Recent Labs  Lab 11/09/2018 2012 10/28/18 0953 10/28/18 1730 10/28/18 2337 10/29/18 0600 10/30/18 0334  WBC 7.4 7.6  --   --  9.2 8.7  HGB 8.3* 8.9* 9.0* 9.7* 9.1* 8.8*  HCT 26.6* 27.8* 28.2* 30.0* 27.9* 27.6*  MCV 91.4 90.6  --   --  90.6 90.8  PLT 134* 126*  --   --  152 0000000   Basic Metabolic Panel: Recent Labs  Lab 10/24/2018 2012 10/29/18 0600 10/30/18 0334  NA 139 143 141  K 3.5 3.8 3.7  CL 107 112* 112*  CO2 22 19* 19*  GLUCOSE 106* 95 97  BUN 12 14 10   CREATININE 1.47* 1.36* 1.35*  CALCIUM 8.6* 8.4* 8.3*   MG  --  2.2  --    GFR: Estimated Creatinine Clearance: 34.9 mL/min (A) (by C-G formula based on SCr of 1.35 mg/dL (H)). Liver Function Tests: Recent Labs  Lab 11/12/2018 2012 10/29/18 0600  AST 64* 42*  ALT 34 26  ALKPHOS 203* 167*  BILITOT 0.9 0.8  PROT 5.7* 4.9*  ALBUMIN 3.0* 2.8*   No results for input(s): LIPASE, AMYLASE in the last 168 hours. No results for input(s): AMMONIA in the last 168 hours. Coagulation Profile: Recent Labs  Lab 10/30/18 1218  INR 1.1   Cardiac Enzymes: No results for input(s): CKTOTAL, CKMB, CKMBINDEX, TROPONINI in the last 168 hours. BNP (last 3 results) No results for input(s): PROBNP in the last 8760 hours. HbA1C: No results for input(s): HGBA1C in the last 72 hours. CBG: No results for input(s): GLUCAP in the last 168 hours. Lipid Profile: No results for input(s): CHOL, HDL, LDLCALC, TRIG, CHOLHDL, LDLDIRECT in the last 72 hours. Thyroid Function Tests: No results for input(s): TSH, T4TOTAL, FREET4, T3FREE, THYROIDAB in the last 72 hours. Anemia Panel: No results for input(s): VITAMINB12, FOLATE, FERRITIN, TIBC, IRON, RETICCTPCT in the last 72 hours. Sepsis Labs: No results for input(s): PROCALCITON, LATICACIDVEN in the last 168 hours.  Recent Results (from the past 240 hour(s))  SARS CORONAVIRUS 2 (TAT 6-24 HRS) Nasopharyngeal Nasopharyngeal Swab     Status: None   Collection Time: 11/02/2018 11:32 PM   Specimen: Nasopharyngeal Swab  Result Value Ref Range Status   SARS Coronavirus 2 NEGATIVE NEGATIVE Final    Comment: (NOTE) SARS-CoV-2 target nucleic acids are NOT DETECTED. The SARS-CoV-2 RNA is generally detectable in upper and lower respiratory specimens during the acute phase of infection. Negative results do not preclude SARS-CoV-2 infection, do not rule out co-infections with other pathogens, and should not be used as the sole basis for treatment or other patient management decisions. Negative results must be combined with  clinical observations, patient history, and epidemiological information. The expected result is Negative. Fact Sheet for Patients: SugarRoll.be Fact Sheet for Healthcare Providers: https://www.woods-mathews.com/ This test is not yet approved or cleared by the [place] FDA and  has been authorized for detection and/or diagnosis of SARS-CoV-2 by FDA under an Emergency Use Authorization (EUA). This EUA will remain  in effect (meaning this test can be used) for the duration of the COVID-19 declaration under Section 56 4(b)(1) of the Act, 21 U.S.C. section 360bbb-3(b)(1), unless the authorization is terminated or revoked sooner. Performed at [facility], [place] [address] [place], [place] [address]   MRSA PCR Screening     Status: None   Collection Time: 10/28/18  3:39 AM   Specimen: Nasal Mucosa; Nasopharyngeal  Result Value Ref Range Status   MRSA by PCR NEGATIVE NEGATIVE Final    Comment:        The GeneXpert MRSA Assay (FDA approved for NASAL specimens only), is one component of a comprehensive MRSA colonization surveillance program. It is not intended to diagnose MRSA infection nor to guide or monitor treatment for MRSA infections. Performed at [facility], [place] [address] [place], [place] [address]          Radiology Studies: Vas [place] Lower Extremity Venous (dvt)  Result Date: 10/29/2018  Lower Venous Study Indications: Pulmonary embolism.  Comparison Study: previous study done 09/27/18 Performing Technologist: Abram Sander RVS  Examination Guidelines: A complete evaluation includes B-mode imaging, spectral Doppler, color Doppler, and power Doppler as needed of all accessible portions of each vessel. Bilateral testing is considered an integral part of a complete examination. Limited examinations for reoccurring indications may be performed as noted.   +---------+---------------+---------+-----------+----------+--------------+ RIGHT    CompressibilityPhasicitySpontaneityPropertiesThrombus Aging +---------+---------------+---------+-----------+----------+--------------+ CFV      Full           Yes      Yes                                 +---------+---------------+---------+-----------+----------+--------------+ SFJ      Full                                                        +---------+---------------+---------+-----------+----------+--------------+ FV Prox  Full                                                        +---------+---------------+---------+-----------+----------+--------------+ FV Mid   Full                                                        +---------+---------------+---------+-----------+----------+--------------+ FV DistalFull                                                        +---------+---------------+---------+-----------+----------+--------------+ PFV      Full                                                        +---------+---------------+---------+-----------+----------+--------------+ POP      Full           Yes      Yes                                 +---------+---------------+---------+-----------+----------+--------------+ PTV      Full                                                        +---------+---------------+---------+-----------+----------+--------------+  PERO                                                  Not visualized +---------+---------------+---------+-----------+----------+--------------+   +---------+---------------+---------+-----------+----------+--------------+ LEFT     CompressibilityPhasicitySpontaneityPropertiesThrombus Aging +---------+---------------+---------+-----------+----------+--------------+ CFV      None           No       No                                   +---------+---------------+---------+-----------+----------+--------------+ SFJ      Full                                                        +---------+---------------+---------+-----------+----------+--------------+ FV Prox  None                                                        +---------+---------------+---------+-----------+----------+--------------+ FV Mid   None                                                        +---------+---------------+---------+-----------+----------+--------------+ FV DistalNone                                                        +---------+---------------+---------+-----------+----------+--------------+ PFV      None                                                        +---------+---------------+---------+-----------+----------+--------------+ POP      None           No       No                                  +---------+---------------+---------+-----------+----------+--------------+ PTV      None                                                        +---------+---------------+---------+-----------+----------+--------------+ PERO  Not visualized +---------+---------------+---------+-----------+----------+--------------+     Summary: Right: There is no evidence of deep vein thrombosis in the lower extremity. No cystic structure found in the popliteal fossa. Left: Findings consistent with acute deep vein thrombosis involving the left common femoral vein, left femoral vein, left proximal profunda vein, left popliteal vein, and left posterior tibial veins. No cystic structure found in the popliteal fossa.  *See table(s) above for measurements and observations. Electronically signed by Curt Jews MD on 10/29/2018 at 4:39:56 PM.    Final         Scheduled Meds: . Chlorhexidine Gluconate Cloth  6 each Topical Daily  . mouth rinse  15 mL Mouth Rinse BID  .  mometasone-formoterol  2 puff Inhalation BID  . pantoprazole  40 mg Oral BID   Continuous Infusions: . heparin 800 Units/hr (10/30/18 1340)     LOS: 2 days    Time spent: 31 minutes spent on chart review, discussion with nursing staff, consultants, updating family and interview/physical exam; more than 50% of that time was spent in counseling and/or coordination of care.   Aaradhya Kysar J [place] DO [facility] Pager [phone]  If 7PM-7AM, please contact night-coverage [email or web address] Password [email or web address] 10/30/2018, 1:41 PM

## 2018-10-30 NOTE — Evaluation (Signed)
Physical Therapy One Time Evaluation Patient Details Name: Scott Harper MRN: [id number] DOB: 09-08-1940 Today's Date: 10/30/2018   History of Present Illness  78 y.o. 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  Patient was admitted to the hospital last month for acute bilateral PE and possible pneumonia versus pulmonary infarct.  Pt admitted 10/31/2018 for Subacute bilateral pulmonary embolism with evidence of worsening right heart strain and pulmonary infarct.  Pt currently on Heparin and scheduled for IVC filter 10/30/18  Clinical Impression  Patient evaluated by Physical Therapy with no further acute PT needs identified. All education has been completed and the patient has no further questions.  See below for any follow-up Physical Therapy or equipment needs. PT is signing off. Thank you for this referral.     Follow Up Recommendations No PT follow up    Equipment Recommendations  None recommended by PT    Recommendations for Other Services       Precautions / Restrictions Precautions Precautions: None Restrictions Weight Bearing Restrictions: No      Mobility  Bed Mobility Overal bed mobility: Modified Independent                Transfers Overall transfer level: Modified independent               General transfer comment: pt up in bathroom on arrival  Ambulation/Gait Ambulation/Gait assistance: Supervision;Modified independent (Device/Increase time) Gait Distance (Feet): 200 Feet Assistive device: IV Pole Gait Pattern/deviations: Step-through pattern;Decreased stride length     General Gait Details: slow but steady gait, pt reports fatigue limiting distance (also NPO for IVC filter procedure today)  Stairs            Wheelchair Mobility    Modified Rankin  (Stroke Patients Only)       Balance Overall balance assessment: No apparent balance deficits (not formally assessed)(denies any falls)                                           Pertinent Vitals/Pain Pain Assessment: 0-10 Pain Score: 5  Pain Location: abdomen Pain Descriptors / Indicators: Pressure Pain Intervention(s): RN gave pain meds during session;Repositioned;Monitored during session    Home Living Family/patient expects to be discharged to:: Private residence Living Arrangements: Children(daughter)           Home Layout: One level Home Equipment: None      Prior Function Level of Independence: Independent               Hand Dominance        Extremity/Trunk Assessment        Lower Extremity Assessment Lower Extremity Assessment: Generalized weakness       Communication   Communication: No difficulties  Cognition Arousal/Alertness: Awake/alert Behavior During Therapy: WFL for tasks assessed/performed Overall Cognitive Status: Within Functional Limits for tasks assessed                                        General Comments      Exercises     Assessment/Plan    PT Assessment Patent does not need any further PT services  PT Problem List         PT Treatment Interventions      PT Goals (Current goals can be found in the Care Plan section)  Acute Rehab PT Goals PT Goal Formulation: All assessment and education complete, DC therapy    Frequency     Barriers to discharge        Co-evaluation               AM-PAC PT "6 Clicks" Mobility  Outcome Measure Help needed turning from your back to your side while in a flat bed without using bedrails?: None Help needed moving from lying on your back to sitting on the side of a flat bed without using bedrails?: None Help needed moving to and from a bed to a chair (including a wheelchair)?: None Help needed standing up from a chair using your arms  (e.g., wheelchair or bedside chair)?: None Help needed to walk in hospital room?: A Little Help needed climbing 3-5 steps with a railing? : A Little 6 Click Score: 22    End of Session   Activity Tolerance: Patient tolerated treatment well Patient left: in bed;with bed alarm set;with call bell/phone within reach   PT Visit Diagnosis: Difficulty in walking, not elsewhere classified (R26.2)    Time: 1100-1109 PT Time Calculation (min) (ACUTE ONLY): 9 min   Charges:   PT Evaluation $PT Eval Low Complexity: Hecker, PT, DPT Acute Rehabilitation Services Office: [phone] Pager: [phone]  Trena Platt 10/30/2018, 12:49 PM

## 2018-10-30 NOTE — Progress Notes (Signed)
[facility] Progress Note  Subjective: The patient states he is doing well.  Has not had any bleeding today.  States he had a scant amount of blood in the stool yesterday but not much.  Objective: Vital signs in last 24 hours: Temp:  [98.2 F (36.8 C)-98.9 F (37.2 C)] 98.6 F (37 C) (10/08 0551) Pulse Rate:  [67-75] 69 (10/08 0551) Resp:  [16-18] 18 (10/08 0551) BP: (120-135)/(77-83) 135/82 (10/08 0551) SpO2:  [98 %-100 %] 98 % (10/08 0809) Weight change:    PE:  No distress  Heart regular rhythm  Abdomen soft nontender  Lab Results: Results for orders placed or performed during the hospital encounter of 11/14/2018 (from the past 24 hour(s))  CBC     Status: Abnormal   Collection Time: 10/30/18  3:34 AM  Result Value Ref Range   WBC 8.7 4.0 - 10.5 K/uL   RBC 3.04 (L) 4.22 - 5.81 MIL/uL   Hemoglobin 8.8 (L) 13.0 - 17.0 g/dL   HCT 27.6 (L) 39.0 - 52.0 %   MCV 90.8 80.0 - 100.0 fL   MCH 28.9 26.0 - 34.0 pg   MCHC 31.9 30.0 - 36.0 g/dL   RDW 15.4 11.5 - 15.5 %   Platelets 153 150 - 400 K/uL   nRBC 0.0 0.0 - 0.2 %  Heparin level (unfractionated)     Status: None   Collection Time: 10/30/18  3:34 AM  Result Value Ref Range   Heparin Unfractionated 0.33 0.30 - 0.70 IU/mL  Basic metabolic panel     Status: Abnormal   Collection Time: 10/30/18  3:34 AM  Result Value Ref Range   Sodium 141 135 - 145 mmol/L   Potassium 3.7 3.5 - 5.1 mmol/L   Chloride 112 (H) 98 - 111 mmol/L   CO2 19 (L) 22 - 32 mmol/L   Glucose, Bld 97 70 - 99 mg/dL   BUN 10 8 - 23 mg/dL   Creatinine, Ser 1.35 (H) 0.61 - 1.24 mg/dL   Calcium 8.3 (L) 8.9 - 10.3 mg/dL   GFR calc non Af Amer 50 (L) >60 mL/min   GFR calc Af Amer 58 (L) >60 mL/min   Anion gap 10 5 - 15    Studies/Results: No results found.    Assessment: Diverticular bleed    Plan:   Seems to be improving.  No intervention from a GI standpoint required at this time.  Continue to follow clinically.    Cassell Clement 10/30/2018, 9:27 AM  Pager: [phone] If no answer or after 5 PM call [phone] Lab Results  Component Value Date   HGB 8.8 (L) 10/30/2018   HGB 9.1 (L) 10/29/2018   HGB 9.7 (L) 10/28/2018   HCT 27.6 (L) 10/30/2018   HCT 27.9 (L) 10/29/2018   HCT 30.0 (L) 10/28/2018   ALKPHOS 167 (H) 10/29/2018   ALKPHOS 203 (H) 11/20/2018   ALKPHOS 127 (H) 09/27/2018   AST 42 (H) 10/29/2018   AST 64 (H) 11/11/2018   AST 37 09/27/2018   ALT 26 10/29/2018   ALT 34 11/03/2018   ALT 30 09/27/2018

## 2018-10-31 DIAGNOSIS — Z515 Encounter for palliative care: Secondary | ICD-10-CM

## 2018-10-31 LAB — BASIC METABOLIC PANEL
Anion gap: 14 (ref 5–15)
BUN: 13 mg/dL (ref 8–23)
CO2: 17 mmol/L — ABNORMAL LOW (ref 22–32)
Calcium: 8.5 mg/dL — ABNORMAL LOW (ref 8.9–10.3)
Chloride: 111 mmol/L (ref 98–111)
Creatinine, Ser: 1.37 mg/dL — ABNORMAL HIGH (ref 0.61–1.24)
GFR calc Af Amer: 57 mL/min — ABNORMAL LOW (ref >60)
GFR calc non Af Amer: 49 mL/min — ABNORMAL LOW (ref >60)
Glucose, Bld: 87 mg/dL (ref 70–99)
Potassium: 3.6 mmol/L (ref 3.5–5.1)
Sodium: 142 mmol/L (ref 135–145)

## 2018-10-31 LAB — CBC
HCT: 31.2 % — ABNORMAL LOW (ref 39.0–52.0)
Hemoglobin: 9.9 g/dL — ABNORMAL LOW (ref 13.0–17.0)
MCH: 28.7 pg (ref 26.0–34.0)
MCHC: 31.7 g/dL (ref 30.0–36.0)
MCV: 90.4 fL (ref 80.0–100.0)
Platelets: 145 10*3/uL — ABNORMAL LOW (ref 150–400)
RBC: 3.45 MIL/uL — ABNORMAL LOW (ref 4.22–5.81)
RDW: 15.4 % (ref 11.5–15.5)
WBC: 10 10*3/uL (ref 4.0–10.5)
nRBC: 0.3 % — ABNORMAL HIGH (ref 0.0–0.2)

## 2018-10-31 LAB — HEPARIN LEVEL (UNFRACTIONATED): Heparin Unfractionated: 0.26 IU/mL — ABNORMAL LOW (ref 0.30–0.70)

## 2018-10-31 MED ORDER — ALUM & MAG HYDROXIDE-SIMETH 200-200-20 MG/5ML PO SUSP
30.0000 mL | Freq: Four times a day (QID) | ORAL | Status: DC | PRN
  Administered 2018-10-31: 30 mL via ORAL
  Filled 2018-10-31 (×2): qty 30

## 2018-10-31 MED ORDER — ENOXAPARIN SODIUM 80 MG/0.8ML ~~LOC~~ SOLN
1.5000 mg/kg | SUBCUTANEOUS | Status: DC
  Administered 2018-10-31: 12:00:00 80 mg via SUBCUTANEOUS
  Filled 2018-10-31: qty 0.8

## 2018-10-31 NOTE — Progress Notes (Signed)
ANTICOAGULATION CONSULT NOTE - Follow Up Consult  Pharmacy Consult for Heparin Indication: pulmonary embolus  No Known Allergies  Patient Measurements: Height: 5\' 8"  (172.7 cm) Weight: 118 lb 13.3 oz (53.9 kg) IBW/kg (Calculated) : 68.4 Heparin Dosing Weight:   Vital Signs: Temp: 97.5 F (36.4 C) (10/08 2205) Temp Source: Oral (10/08 2205) BP: 128/86 (10/08 2205) Pulse Rate: 77 (10/08 2205)  Labs: Recent Labs    10/28/18 1330 10/28/18 1730 10/28/18 2104 10/28/18 2337 10/29/18 0600 10/29/18 0755 10/30/18 0334 10/30/18 1218 10/31/18 0333  HGB  --  9.0*  --  9.7* 9.1*  --  8.8*  --  9.9*  HCT  --  28.2*  --  30.0* 27.9*  --  27.6*  --  31.2*  PLT  --   --   --   --  152  --  153  --  145*  APTT 154*  --   --  107*  --   --   --   --   --   LABPROT  --   --   --   --   --   --   --  13.7  --   INR  --   --   --   --   --   --   --  1.1  --   HEPARINUNFRC 0.67  --   --  0.38  --  0.43 0.33  --  0.26*  CREATININE  --   --   --   --  1.36*  --  1.35*  --  1.37*  TROPONINIHS 153* 138* 133*  --   --   --   --   --   --     Estimated Creatinine Clearance: 34.4 mL/min (A) (by C-G formula based on SCr of 1.37 mg/dL (H)).   Medications:  Infusions:  . heparin 800 Units/hr (10/30/18 1340)    Assessment: Patient with low heparin level.  No heparin issues per RN.  RN reminded me that patient was still having some blood in stool but not a change from earlier.  Goal of Therapy: (reduced goal ranges) Heparin level 0.3-0.5 units/ml Monitor platelets by anticoagulation protocol: Yes   Plan:  Increase heparin to 850 units/hr Recheck heparin level at 1400  [address] [place] 10/31/2018,5:29 AM

## 2018-10-31 NOTE — TOC Progression Note (Signed)
Transition of Care [facility]) - Progression Note    Patient Details  Name: Scott Harper MRN: [id number] Date of Birth: [birth year]  Transition [facility]) CM/SW Contact  Purcell Mouton, RN Phone Number: 10/31/2018, 10:18 AM  Clinical Narrative:     Unable to make an appointment at [facility] related to the pt not being their pt. Pt will need to make appointment with their MD for follow up.        Expected Discharge Plan and Services                                                 Social Determinants of Health (SDOH) Interventions    Readmission Risk Interventions No flowsheet data found.

## 2018-10-31 NOTE — Progress Notes (Signed)
PROGRESS NOTE    Scott Harper  [id number] DOB: 06-16-1940 DOA: 11/06/2018 PCP: Patient, No Pcp Per   Brief Narrative:   Scott Harper is a 78 y.o. 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  Patient was admitted to the hospital last month for acute bilateral PE and possible pneumonia versus pulmonary infarct.  He was started on Eliquis.  Patient reports having shortness of breath for the past few weeks.  Denies cough.  States he has noticed blood in his stool since he started taking Eliquis.  He has not vomited any blood.  He has also been having epigastric abdominal pain.  Denies history of prior GI bleed.  States he stopped taking Eliquis 4 to 5 days ago as he was noticing blood in his stool.  No additional history could be obtained from him.  ED Course: Not hypoxic, placed on nonrebreather by EMS for comfort.  Blood pressure soft.  Slightly tachypneic.  Hemoglobin 8.3, was 11.3 on 09/28/2018.  BNP normal.  High-sensitivity troponin 108.  EKG not suggestive of ACS.  FOBT positive.  SARS-CoV-2 test pending. Chest x-ray showing no active disease. Patient received 1 L normal saline bolus.  1 unit PRBCs ordered.    Assessment & Plan:   Principal Problem:   Pulmonary embolism (HCC) Active Problems:   Metastatic cancer [facility])   Hospice care patient   GI bleed   Acute blood loss anemia   Metastatic colon cancer to liver [facility])   Acute deep vein thrombosis (DVT) of femoral vein of left lower extremity (HCC)   Acute lower GI bleeding   Subacute bilateral pulmonary embolism with evidence of worsening right heart strain Pulmonary infarct Elevated troponin, likely demand ischemia from right heart strain secondary to PE Recently discharged on 09/26/2018 for acute bilateral PE in the setting of  metastatic cancer, and discharged on Eliquis.  He has failed Eliquis as repeat CT angiogram showing bilateral PE and evidence of worsening right heart strain (RV to LV ratio of 2.2).  There is evidence of new emboli and clot propagation from older emboli.  Wedge-shaped area of opacity in the lingula, likely an infarct. High-sensitivity troponin elevated in the setting of right heart strain (108 > 197 > 277).  EKG without acute ischemic changes.  Difficult situation as patient has also been having intermittent hematochezia since he was started on Eliquis and hemoglobin with 3 g drop compared to labs done during hospitalization a month ago.  FOBT positive and stool mixed with blood on exam done by ED provider.  Case was discussed with Dr. Ronnette Juniper from PCCM and Dr. Laurence Ferrari with IR by admitting provider; and was deemed patient not a candidate for thrombolytics given active GI bleed.  The only option at this point is trying unfractionated heparin at a low dose and monitoring closely for worsening GI bleed.    Echocardiogram with EF [id number], diastolic dysfunction, RV pressure overload, mild RV reduced function, positive McConnell sign, RA moderately dilated, mild TR, normal IVC.   --Transition IV heparin drip to treatment dose Lovenox today --Continue to monitor hemoglobin closely  Acute blood loss anemia secondary to acute GI bleed, likely lower Patient presenting with shortness of breath.  Hemoglobin on admission noted to be 8.3, which is a 3 g drop since 09/28/2018 with hemoglobin 11.3.  Recently started on Eliquis for  PE as above.  FOBT positive and stool mixed with blood on exam during admission. Etiology likely secondary to anticoagulation and further complicated by his history of metastatic colorectal cancer, presum lower GIB at this point as he initially stated some of bright red blood in his stool. [facility] consulted, appreciate assistance; signed off 10/9 --s/p 1u pRBC on 10/6 --Hgb  8.3-->8.9-->9.0-->9.7-->9.1-->8.8-->9.9; now appears stable --PPI drip transition to Protonix 40 mg p.o. twice daily 10/7   Acute left lower extremity DVT Lower externally venous duplex scan notable for acute DVT in the left common femoral,, left femoral, left popliteal, left posterior tibial veins.  Of which this is a new finding since last venous duplex scan back in early September 2020.  This is another confirmation that he has failed outpatient Eliquis therapy. 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  Patient is followed by [facility], Dr. Estelle Grumbles.  Last seen in office on 07/18/2018 with repeat CT scan at that time showing new and a large liver lesions with recommendations of hospice. --Palliative care following, appreciate assistance   DVT prophylaxis: SCDs, heparin drip to transition to treatment dose lovenox today Code Status: DNR Family Communication: Discussed with patient's daughter via telephone Disposition Plan: Continue inpatient, IVC placed yesterday by IR, plan to transition heparin drip to Lovenox treatment dose today, ultimately still a very poor/grim prognosis, possible discharge home in the next 1-2 days if hemoglobin remains stable  Consultants:   Case was discussed by admitting provider with interventional radiology and PCCM.  [facility]: singed off 10/9  Interventional radiology  Procedures:   IVC filter placement by IR on 10/30/2018  Antimicrobials:   none   Subjective: Patient seen and examined bedside, resting comfortably. No other complaints.  States had one  small bowel movement late last night with some "speckled" blood.  Hemoglobin actually rising up to 9.9 this morning.  Denies headache, no fever/chills/night sweats, no chest pain, no shortness of breath, palpitations, no cough/congestion.  No acute events overnight per nursing staff.  Objective: Vitals:   10/30/18 2205 10/31/18 0544 10/31/18 0846 10/31/18 1208  BP: 128/86 135/80  115/77  Pulse: 77 66  71  Resp: 20 18  18   Temp: (!) 97.5 F (36.4 C) 98.8 F (37.1 C)  98.7 F (37.1 C)  TempSrc: Oral Oral  Oral  SpO2: 92% 100% 100% 100%  Weight:      Height:        Intake/Output Summary (Last 24 hours) at 10/31/2018 1352 Last data filed at 10/31/2018 1300 Gross per 24 hour  Intake 307.92 ml  Output 500 ml  Net -192.08 ml   Filed Weights   10/28/18 0413  Weight: 53.9 kg    Examination:  General exam: Appears calm and comfortable, thin and cachectic in appearance Respiratory system: Clear to auscultation. Respiratory effort normal.  Oxygenating well on room air Cardiovascular system: S1 & S2 heard, RRR. No JVD, murmurs, rubs, gallops or clicks. No pedal edema. Gastrointestinal system: Abdomen is nondistended, soft and nontender. No organomegaly or masses felt. Normal bowel sounds heard. Central nervous system: Alert and oriented. No focal neurological deficits. Extremities: Symmetric 5 x 5  power. Skin: No rashes, lesions or ulcers Psychiatry: Judgement and insight appear normal. Mood & affect appropriate.     Data Reviewed: I have personally reviewed following labs and imaging studies  CBC: Recent Labs  Lab 10/26/2018 2012 10/28/18 0953 10/28/18 1730 10/28/18 2337 10/29/18 0600 10/30/18 0334 10/31/18 0333  WBC 7.4 7.6  --   --  9.2 8.7 10.0  HGB 8.3* 8.9* 9.0* 9.7* 9.1* 8.8* 9.9*  HCT 26.6* 27.8* 28.2* 30.0* 27.9* 27.6* 31.2*  MCV 91.4 90.6  --   --  90.6 90.8 90.4  PLT 134* 126*  --   --  152 153 [id number]*   Basic Metabolic Panel: Recent Labs  Lab 10/31/2018 2012  10/29/18 0600 10/30/18 0334 10/31/18 0333  NA 139 143 141 142  K 3.5 3.8 3.7 3.6  CL 107 112* 112* 111  CO2 22 19* 19* 17*  GLUCOSE 106* 95 97 87  BUN 12 14 10 13   CREATININE 1.47* 1.36* 1.35* 1.37*  CALCIUM 8.6* 8.4* 8.3* 8.5*  MG  --  2.2  --   --    GFR: Estimated Creatinine Clearance: 34.4 mL/min (A) (by C-G formula based on SCr of 1.37 mg/dL (H)). Liver Function Tests: Recent Labs  Lab 11/15/2018 2012 10/29/18 0600  AST 64* 42*  ALT 34 26  ALKPHOS 203* 167*  BILITOT 0.9 0.8  PROT 5.7* 4.9*  ALBUMIN 3.0* 2.8*   No results for input(s): LIPASE, AMYLASE in the last 168 hours. No results for input(s): AMMONIA in the last 168 hours. Coagulation Profile: Recent Labs  Lab 10/30/18 1218  INR 1.1   Cardiac Enzymes: No results for input(s): CKTOTAL, CKMB, CKMBINDEX, TROPONINI in the last 168 hours. BNP (last 3 results) No results for input(s): PROBNP in the last 8760 hours. HbA1C: No results for input(s): HGBA1C in the last 72 hours. CBG: No results for input(s): GLUCAP in the last 168 hours. Lipid Profile: No results for input(s): CHOL, HDL, LDLCALC, TRIG, CHOLHDL, LDLDIRECT in the last 72 hours. Thyroid Function Tests: No results for input(s): TSH, T4TOTAL, FREET4, T3FREE, THYROIDAB in the last 72 hours. Anemia Panel: No results for input(s): VITAMINB12, FOLATE, FERRITIN, TIBC, IRON, RETICCTPCT in the last 72 hours. Sepsis Labs: No results for input(s): PROCALCITON, LATICACIDVEN in the last 168 hours.  Recent Results (from the past 240 hour(s))  SARS CORONAVIRUS 2 (TAT 6-24 HRS) Nasopharyngeal Nasopharyngeal Swab     Status: None   Collection Time: 11/06/2018 11:32 PM   Specimen: Nasopharyngeal Swab  Result Value Ref Range Status   SARS Coronavirus 2 NEGATIVE NEGATIVE Final    Comment: (NOTE) SARS-CoV-2 target nucleic acids are NOT DETECTED. The SARS-CoV-2 RNA is generally detectable in upper and lower respiratory specimens during the acute phase of infection.  Negative results do not preclude SARS-CoV-2 infection, do not rule out co-infections with other pathogens, and should not be used as the sole basis for treatment or other patient management decisions. Negative results must be combined with clinical observations, patient history, and epidemiological information. The expected result is Negative. Fact Sheet for Patients: SugarRoll.be Fact Sheet for Healthcare Providers: https://www.woods-mathews.com/ This test is not yet approved or cleared by the [place] FDA and  has been authorized for detection and/or diagnosis of SARS-CoV-2 by FDA under an Emergency Use Authorization (EUA). This EUA will remain  in effect (meaning this test can be used) for the duration of the COVID-19 declaration under Section 56 4(b)(1) of the Act, 21 U.S.C. section 360bbb-3(b)(1), unless the authorization is  terminated or revoked sooner. Performed at [facility], [place] [address] [place], [place] [address]   MRSA PCR Screening     Status: None   Collection Time: 10/28/18  3:39 AM   Specimen: Nasal Mucosa; Nasopharyngeal  Result Value Ref Range Status   MRSA by PCR NEGATIVE NEGATIVE Final    Comment:        The GeneXpert MRSA Assay (FDA approved for NASAL specimens only), is one component of a comprehensive MRSA colonization surveillance program. It is not intended to diagnose MRSA infection nor to guide or monitor treatment for MRSA infections. Performed at [facility], [place] [address], [place], [place] [address]          Radiology Studies: Ir Ivc Filter Plmt / S&i /img Guid/mod Sed  Result Date: 10/30/2018 INDICATION: 78 year old male with a history of pulmonary emboli previously on anticoagulation. Unfortunately, he has developed a gastrointestinal bleed requiring transfusion and cessation of anticoagulation. CT imaging demonstrates new/worsening pulmonary emboli. Therefore,  he presents for placement of an IVC filter. He has known malignancy and is on hospice. Therefore, this IVC filter will be considered permanent and not followed for retrieval. EXAM: ULTRASOUND GUIDANCE FOR VASCULARACCESS IVC CATHETERIZATION AND VENOGRAM IVC FILTER INSERTION Interventional Radiologist:  Criselda Peaches, MD MEDICATIONS: None. ANESTHESIA/SEDATION: Fentanyl 100 mcg IV; Versed 2 mg IV Moderate Sedation Time:  11 minutes The patient was continuously monitored during the procedure by the interventional radiology nurse under my direct supervision. FLUOROSCOPY TIME:  Fluoroscopy Time: 0 minutes 30 seconds (22 mGy). COMPLICATIONS: None immediate. PROCEDURE: Informed written consent was obtained from the patient after a thorough discussion of the procedural risks, benefits and alternatives. All questions were addressed. Maximal Sterile Barrier Technique was utilized including caps, mask, sterile gowns, sterile gloves, sterile drape, hand hygiene and skin antiseptic. A timeout was performed prior to the initiation of the procedure. Maximal barrier sterile technique utilized including caps, mask, sterile gowns, sterile gloves, large sterile drape, hand hygiene, and Betadine prep. Under sterile condition and local anesthesia, right internal jugular venous access was performed with ultrasound. An ultrasound image was saved and sent to PACS. Over a guidewire, the IVC filter delivery sheath and inner dilator were advanced into the IVC just above the IVC bifurcation. Contrast injection was performed for an IVC venogram. Through the delivery sheath, a retrievable Denali IVC filter was deployed below the level of the renal veins and above the IVC bifurcation. Limited post deployment venacavagram was performed. The delivery sheath was removed and hemostasis was obtained with manual compression. A dressing was placed. The patient tolerated the procedure well without immediate post procedural complication. FINDINGS:  The IVC is patent. No evidence of thrombus, stenosis, or occlusion. No variant venous anatomy. Successful placement of the IVC filter below the level of the renal veins. IMPRESSION: Successful ultrasound and fluoroscopically guided placement of an infrarenal retrievable IVC filter via right jugular approach. PLAN: Due to patient related comorbidities and/or clinical necessity, this IVC filter should be considered a permanent device. This patient will not be actively followed for future filter retrieval. Signed, Criselda Peaches, MD, [facility] Electronically Signed   By: Jacqulynn Cadet M.D.   On: 10/30/2018 17:39        Scheduled Meds: . Chlorhexidine Gluconate Cloth  6 each Topical Daily  . enoxaparin (LOVENOX) injection  1.5 mg/kg Subcutaneous Q24H  . mouth rinse  15 mL Mouth Rinse BID  . mometasone-formoterol  2  puff Inhalation BID  . pantoprazole  40 mg Oral BID   Continuous Infusions:    LOS: 3 days    Time spent: 31 minutes spent on chart review, discussion with nursing staff, consultants, updating family and interview/physical exam; more than 50% of that time was spent in counseling and/or coordination of care.   Lovis More J [place] DO [facility] Pager [phone]  If 7PM-7AM, please contact night-coverage [email or web address] Password [email or web address] 10/31/2018, 1:52 PM

## 2018-10-31 NOTE — Progress Notes (Signed)
Daily Progress Note   Patient Name: Scott Harper       Date: 10/31/2018 DOB: 11-May-1940  Age: 78 y.o. MRN#: [id number] Attending Physician: [place] Eric J, DO Primary Care Physician: Patient, No Pcp Per Admit Date: 11/15/2018  Reason for Consultation/Follow-up: Establishing goals of care  Subjective: I met today with Scott Harper and his daughter.  Discussed plans for discharge with his daughter in conjunction with CM.    He is s/p filter placement but will need to continue to have anticoagulation on discharge.  Started on Lovenox and discussed with daughter that we will need to see if hospice will cover this.  CM discussed with his current hospice agency and they expressed concern that family may not be aligned with hospice philosophy as he has been to hospital multiple times while receiving home hospice benefits.  Discussed this with daughter, and she feels that calling ambulance has been necessitated by her not getting support she needs from hospice agency in a timely manner- "I am not going to sit there and watch my father suffer while I am waiting for them to find someone to come to see him."  She reports that her father desires home hospice on discharge and she wants to do as he wishes.  She is in agreement with seeing if another agency may take him on as she feels more timely response may prevent future hospitalizations.   Length of Stay: 3  Current Medications: Scheduled Meds:  . Chlorhexidine Gluconate Cloth  6 each Topical Daily  . enoxaparin (LOVENOX) injection  1.5 mg/kg Subcutaneous Q24H  . mouth rinse  15 mL Mouth Rinse BID  . mometasone-formoterol  2 puff Inhalation BID  . pantoprazole  40 mg Oral BID    Continuous Infusions:   PRN Meds: acetaminophen **OR**  acetaminophen, ondansetron (ZOFRAN) IV  Physical Exam         General: Alert, awake, in no acute distress. Chronically ill appearing. Heart: Regular rate and rhythm. No murmur appreciated. Lungs: Good air movement, clear Abdomen: Soft, globally tender, nondistended, positive bowel sounds.  Ext: No significant edema Skin: Warm and dry Neuro: Grossly intact, nonfocal  Vital Signs: BP 115/77 (BP Location: Left Arm)   Pulse 71   Temp 98.7 F (37.1 C) (Oral)   Resp 18   Ht '5\' 8"'$  (  1.727 m)   Wt 53.9 kg   SpO2 100%   BMI 18.07 kg/m  SpO2: SpO2: 100 % O2 Device: O2 Device: Nasal Cannula O2 Flow Rate: O2 Flow Rate (L/min): 2 L/min  Intake/output summary:   Intake/Output Summary (Last 24 hours) at 10/31/2018 1809 Last data filed at 10/31/2018 1300 Gross per 24 hour  Intake 240.07 ml  Output 500 ml  Net -259.93 ml   LBM: Last BM Date: 10/31/18 Baseline Weight: Weight: 53.9 kg Most recent weight: Weight: 53.9 kg       Palliative Assessment/Data:    Flowsheet Rows     Most Recent Value  Intake Tab  Referral Department  Hospitalist  Unit at Time of Referral  ICU  Palliative Care Primary Diagnosis  Cancer  Date Notified  10/28/18  Palliative Care Type  New Palliative care  Reason for referral  Clarify Goals of Care  Date of Admission  10/30/2018  Date first seen by Palliative Care  10/28/18  # of days Palliative referral response time  0 Day(s)  # of days IP prior to Palliative referral  1  Clinical Assessment  Palliative Performance Scale Score  40%  Psychosocial & Spiritual Assessment  Palliative Care Outcomes  Patient/Family meeting held?  Yes  Who was at the meeting?  Patient      Patient Active Problem List   Diagnosis Date Noted  . Acute deep vein thrombosis (DVT) of femoral vein of left lower extremity ([place]) 10/29/2018  . Acute lower GI bleeding 10/29/2018  . GI bleed 10/28/2018  . Pulmonary embolism ([place]) 10/28/2018  . Acute blood loss anemia 10/28/2018   . Metastatic colon cancer to liver ([place]) 10/28/2018  . Lobar pneumonia ([place])   . Metastatic cancer ([place])   . Hospice care patient   . CKD (chronic kidney disease), stage III   . Hypokalemia   . CAP (community acquired pneumonia) 09/26/2018  . ARF (acute renal failure) ([place]) 09/26/2018    Palliative Care Assessment & Plan   Patient Profile: 78 y.o. male  with past medical history of met colon cancer s/p right hemicolectomy, PE on Eliquis currently on hospice admitted on 11/16/2018 with SOB.  Found to have worsening right heart strain related to progression of subacute bilateral PE.  Course further complicated by GI bleed while on anticoagulation.  Palliative consulted for [place].   Assessment: Patient Active Problem List   Diagnosis Date Noted  . Acute deep vein thrombosis (DVT) of femoral vein of left lower extremity ([place]) 10/29/2018  . Acute lower GI bleeding 10/29/2018  . GI bleed 10/28/2018  . Pulmonary embolism ([place]) 10/28/2018  . Acute blood loss anemia 10/28/2018  . Metastatic colon cancer to liver ([place]) 10/28/2018  . Lobar pneumonia ([place])   . Metastatic cancer ([place])   . Hospice care patient   . CKD (chronic kidney disease), stage III   . Hypokalemia   . CAP (community acquired pneumonia) 09/26/2018  . ARF (acute renal failure) ([place]) 09/26/2018   Recommendations/Plan: - S/p IVC filter.  Plan for lovenox for anticoagulation.  Will need to determine if this is something hospice will cover on discharge. - Daughter would like to see if another hospice agency may be better fit for her father.  Appreciate CM assistance.  Code Status:    Code Status Orders  (From admission, onward)         Start     Ordered   10/28/18 0047  Do not attempt resuscitation (DNR)  Continuous  Question Answer Comment  In the event of cardiac or respiratory ARREST Do not call a "code blue"   In the event of cardiac or respiratory ARREST Do not perform Intubation, CPR, defibrillation or ACLS    In the event of cardiac or respiratory ARREST Use medication by any route, position, wound care, and other measures to relive pain and suffering. May use oxygen, suction and manual treatment of airway obstruction as needed for comfort.      10/28/18 0052        Code Status History    Date Active Date Inactive Code Status Order ID Comments User Context   09/27/2018 0138 09/28/2018 1901 DNR [id number]  Rise Patience, MD Inpatient   09/27/2018 0106 09/27/2018 0138 Full Code [id number]  Rise Patience, MD Inpatient   Advance Care Planning Activity       Prognosis:   < 6 months  Discharge Planning:  Home with Hospice once medically ready  Care plan was discussed with patient, daughter, case management  Thank you for allowing the Palliative Medicine Team to assist in the care of this patient.   Time In: 1430 Time Out: 1515 Total Time 45 Prolonged Time Billed No      Greater than 50%  of this time was spent counseling and coordinating care related to the above assessment and plan.  Micheline Rough, MD  Please contact Palliative Medicine Team phone at [phone] for questions and concerns.

## 2018-10-31 NOTE — Progress Notes (Signed)
Pt requesting I call and get something for indigestion. Lorra Hals notified of above.

## 2018-10-31 NOTE — Progress Notes (Addendum)
Hydrologist [facility])  Hospital Liaison: RN note   Notified by [facility] [place], RN of patient/family request for [facility] services at home after discharge. Chart and patient information reviewed by [facility] physician. Hospice eligibility confirmed.     Writer spoke with daughter, Glenard Haring  to initiate education related to hospice philosophy, services and team approach to care. Glenard Haring  verbalized understanding of information given. Per discussion, plan is for discharge to home by private vehicle.     Please send signed and completed DNR form home with patient/family. Patient will need prescriptions for discharge comfort medications.     DME needs have been discussed, patient currently has the following equipment in the home: oxygen, hospital bed, nebulizer.  Patient/family requests the following DME for delivery to the home:   none.     [facility] aware of the above. Please notify [facility] when patient is ready to leave the unit at discharge. (Call [phone] or [phone] after 5pm.) [facility] information and contact numbers given to   .      Please call with any hospice related questions.     Thank you for this referral.     Farrel Gordon, RN, CCM  [place] (listed on AMION under Hospice and [place] [facility] [place])  [phone]

## 2018-10-31 NOTE — TOC Progression Note (Signed)
Transition of Care [facility]) - Progression Note    Patient Details  Name: Scott Harper MRN: [id number] Date of Birth: 19-Jul-1940  Transition of Care [facility]) CM/SW Contact  Purcell Mouton, RN Phone Number: 10/31/2018, 5:20 PM  Clinical Narrative:    [facility] will follow pt at discharge.  MD/Pt failed Eliquis.         Expected Discharge Plan and Services                                                 Social Determinants of Health (SDOH) Interventions    Readmission Risk Interventions No flowsheet data found.

## 2018-10-31 NOTE — Evaluation (Signed)
Occupational Therapy Evaluation Patient Details Name: Scott Harper MRN: [id number] DOB: 1940-06-15 Today's Date: 10/31/2018    History of Present Illness 78 y.o. 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  Patient was admitted to the hospital last month for acute bilateral PE and possible pneumonia versus pulmonary infarct.  Pt admitted 11/18/2018 for Subacute bilateral pulmonary embolism with evidence of worsening right heart strain and pulmonary infarct.  Pt currently on Heparin and scheduled for IVC filter 10/30/18   Clinical Impression   Pt was admitted for the above. At baseline, he is mod I and he is mostly at a set up/supervision level.  Educated on energy conservation, and pt intiiated a rest break on his own.  No further OT is needed at this time    Follow Up Recommendations  No OT follow up    Equipment Recommendations  Tub/shower seat(maybe hospice can provide)    Recommendations for Other Services       Precautions / Restrictions Precautions Precautions: None Restrictions Weight Bearing Restrictions: No      Mobility Bed Mobility Overal bed mobility: Modified Independent                Transfers Overall transfer level: Modified independent                    Balance                                           ADL either performed or assessed with clinical judgement   ADL Overall ADL's : Needs assistance/impaired                                       General ADL Comments: pt is able to complete adl at set up level; supervision when standing at sink for oral care. Ambulated in hall with supervisoin for safety and pt initiated a rest break in standing.  Dyspnea 2/4 but sats 98-99% on 2 liters 02.  Educated on energy conservation.        Vision         Perception     Praxis      Pertinent Vitals/Pain Pain Assessment: No/denies pain     Hand Dominance     Extremity/Trunk Assessment Upper Extremity Assessment Upper Extremity Assessment: Generalized weakness           Communication Communication Communication: No difficulties   Cognition Arousal/Alertness: Awake/alert Behavior During Therapy: WFL for tasks assessed/performed Overall Cognitive Status: Within Functional Limits for tasks assessed                                     General Comments       Exercises     Shoulder Instructions      Home Living Family/patient expects to be discharged to:: Private residence Living Arrangements: Children                 Bathroom Shower/Tub: Teacher, early years/pre: Standard  Prior Functioning/Environment Level of Independence: Independent                 OT Problem List:        OT Treatment/Interventions:      OT Goals(Current goals can be found in the care plan section) Acute Rehab OT Goals Patient Stated Goal: none stated OT Goal Formulation: All assessment and education complete, DC therapy  OT Frequency:     Barriers to D/C:            Co-evaluation              AM-PAC OT "6 Clicks" Daily Activity     Outcome Measure Help from another person eating meals?: None Help from another person taking care of personal grooming?: A Little Help from another person toileting, which includes using toliet, bedpan, or urinal?: A Little Help from another person bathing (including washing, rinsing, drying)?: A Little Help from another person to put on and taking off regular upper body clothing?: A Little Help from another person to put on and taking off regular lower body clothing?: A Little 6 Click Score: 19   End of Session    Activity Tolerance: Patient tolerated treatment well Patient left: in bed;with call bell/phone  within reach;with bed alarm set  OT Visit Diagnosis: Muscle weakness (generalized) (M62.81)                Time: 1510-1530 OT Time Calculation (min): 20 min Charges:  OT General Charges $OT Visit: 1 Visit OT Evaluation $OT Eval Low Complexity: [place], OTR/L Acute Rehabilitation Services [phone] [facility] pager [phone] office 10/31/2018  Charla Criscione 10/31/2018, 3:38 PM

## 2018-10-31 NOTE — Care Management Important Message (Signed)
Important Message  Patient Details IM Letter given to Cookie McGibboney RN to present to the Patient Name: Rien Patrice MRN: [id number] Date of Birth: [birth year]   Medicare Important Message Given:  Yes     [name] 10/31/2018, 11:54 AM

## 2018-10-31 NOTE — TOC Benefit Eligibility Note (Signed)
Transition of Care [facility]) Benefit Eligibility Note    Patient Details  Name: Scott Harper MRN: [id number] Date of Birth: 1940-06-08   Medication/Dose: 120 mg 2 x day for 30days  Covered?: Yes     Prescription Coverage [facility]erred [facility]: [facility]  Spoke with Person/Company/Phone Number:: [name]/ [facility] [phone]  Co-Pay: $540.20  Prior Approval: No     Additional Notes: tried to call [name] Medicare was on hold for 55 minutes that's why his pharmacy was called    [name] Phone Number: 10/31/2018, 2:48 PM

## 2018-10-31 NOTE — TOC Progression Note (Signed)
Transition of Care [facility]) - Progression Note    Patient Details  Name: Maxtin Tomasino MRN: [id number] Date of Birth: Jun 17, 1940  Transition of Care [facility]) CM/SW Contact  Purcell Mouton, RN Phone Number: 10/31/2018, 3:42 PM  Clinical Narrative:    Spoke with pt's daughter who states that her father was with [facility] However do not have them at present time related to coming into the hospital. Daughter states that pt was in pain, she call hospice which was unable to see pt for 45 mins. A call to [facility] confirmed that pt was no longer their pt since he came into the hospice. Referral given to [facility] to see if they will accept pt.         Expected Discharge Plan and Services                                                 Social Determinants of Health (SDOH) Interventions    Readmission Risk Interventions No flowsheet data found.

## 2018-10-31 NOTE — Progress Notes (Signed)
ANTICOAGULATION CONSULT NOTE   Pharmacy Consult for [facility] Indication: pulmonary embolus  No Known Allergies  Patient Measurements: Height: 5\' 8"  (172.7 cm) Weight: 118 lb 13.3 oz (53.9 kg) IBW/kg (Calculated) : 68.4 Heparin Dosing Weight:   Vital Signs: Temp: 98.8 F (37.1 C) (10/09 0544) Temp Source: Oral (10/09 0544) BP: 135/80 (10/09 0544) Pulse Rate: 66 (10/09 0544)  Labs: Recent Labs    10/28/18 1330  10/28/18 1730 10/28/18 2104 10/28/18 2337 10/29/18 0600 10/29/18 0755 10/30/18 0334 10/30/18 1218 10/31/18 0333  HGB  --    < > 9.0*  --  9.7* 9.1*  --  8.8*  --  9.9*  HCT  --    < > 28.2*  --  30.0* 27.9*  --  27.6*  --  31.2*  PLT  --   --   --   --   --  152  --  153  --  145*  APTT 154*  --   --   --  107*  --   --   --   --   --   LABPROT  --   --   --   --   --   --   --   --  13.7  --   INR  --   --   --   --   --   --   --   --  1.1  --   HEPARINUNFRC 0.67  --   --   --  0.38  --  0.43 0.33  --  0.26*  CREATININE  --   --   --   --   --  1.36*  --  1.35*  --  1.37*  TROPONINIHS 153*  --  138* 133*  --   --   --   --   --   --    < > = values in this interval not displayed.    Estimated Creatinine Clearance: 34.4 mL/min (A) (by C-G formula based on SCr of 1.37 mg/dL (H)).  Assessment: Patient on apixaban prior to admission for PE.  MD determined patient has failed apixaban and desires for heparin without bolus per pharmacy to begin.  MD also would like reduce goal range for levels (0.3 -0.5 heparin level or 66-85 secs)  Baseline coags ordered. LD apixaban 10/5  10/31/2018  To transition UFH to LMWH for long-term anticoagulation in pt with PE/DVT and LGIB who failed apixaban therapy.  To DC home with hospice support. IVC filter placed by IR 10/8 - permanent.  10/9 LGIB appears to have stopped per GI note Hg 9.9 - stable. PLTC 145 - sl low. SCr 1.37. CrCl > 30 ml/min.    Plan:  DC heparin drip at 11 am Start LMWH 1.5 mg/kg = 80 mg sq q24 at 12  noon today Dc heparin levels Continue daily CBC   Eudelia Bunch, Pharm.D [phone] 10/31/2018 10:27 AM

## 2018-10-31 NOTE — Progress Notes (Signed)
[facility] PROGRESS NOTE Subjective Pt without further bleeding Had IVC filter placed still on heparin. On hospice care for metastatic colon CA.  Objective: Vital signs in last 24 hours: Temp:  [97.5 F (36.4 C)-98.8 F (37.1 C)] 98.8 F (37.1 C) (10/09 0544) Pulse Rate:  [66-77] 66 (10/09 0544) Resp:  [16-22] 18 (10/09 0544) BP: (112-135)/(73-86) 135/80 (10/09 0544) SpO2:  [92 %-100 %] 100 % (10/09 0846) Last BM Date: 10/31/18  Intake/Output from previous day: 10/08 0701 - 10/09 0700 In: 187.9 [I.V.:187.9] Out: 500 [Urine:500] Intake/Output this shift: No intake/output data recorded.   Lab Results: Recent Labs    10/28/18 0953 10/28/18 1730 10/28/18 2337 10/29/18 0600 10/30/18 0334 10/31/18 0333  WBC 7.6  --   --  9.2 8.7 10.0  HGB 8.9* 9.0* 9.7* 9.1* 8.8* 9.9*  HCT 27.8* 28.2* 30.0* 27.9* 27.6* 31.2*  PLT 126*  --   --  152 153 145*   BMET Recent Labs    10/29/18 0600 10/30/18 0334 10/31/18 0333  NA 143 141 142  K 3.8 3.7 3.6  CL 112* 112* 111  CO2 19* 19* 17*  CREATININE 1.36* 1.35* 1.37*   LFT Recent Labs    10/29/18 0600  PROT 4.9*  AST 42*  ALT 26  ALKPHOS 167*  BILITOT 0.8   PT/INR Recent Labs    10/30/18 1218  LABPROT 13.7  INR 1.1   PANCREAS No results for input(s): LIPASE in the last 72 hours.       Studies/Results: Ir Ivc Filter Plmt / S&i /img Guid/mod Sed  Result Date: 10/30/2018 INDICATION: 78 year old male with a history of pulmonary emboli previously on anticoagulation. Unfortunately, he has developed a gastrointestinal bleed requiring transfusion and cessation of anticoagulation. CT imaging demonstrates new/worsening pulmonary emboli. Therefore, he presents for placement of an IVC filter. He has known malignancy and is on hospice. Therefore, this IVC filter will be considered permanent and not followed for retrieval. EXAM: ULTRASOUND GUIDANCE FOR VASCULARACCESS IVC CATHETERIZATION AND VENOGRAM IVC FILTER  INSERTION Interventional Radiologist:  Criselda Peaches, MD MEDICATIONS: None. ANESTHESIA/SEDATION: Fentanyl 100 mcg IV; Versed 2 mg IV Moderate Sedation Time:  11 minutes The patient was continuously monitored during the procedure by the interventional radiology nurse under my direct supervision. FLUOROSCOPY TIME:  Fluoroscopy Time: 0 minutes 30 seconds (22 mGy). COMPLICATIONS: None immediate. PROCEDURE: Informed written consent was obtained from the patient after a thorough discussion of the procedural risks, benefits and alternatives. All questions were addressed. Maximal Sterile Barrier Technique was utilized including caps, mask, sterile gowns, sterile gloves, sterile drape, hand hygiene and skin antiseptic. A timeout was performed prior to the initiation of the procedure. Maximal barrier sterile technique utilized including caps, mask, sterile gowns, sterile gloves, large sterile drape, hand hygiene, and Betadine prep. Under sterile condition and local anesthesia, right internal jugular venous access was performed with ultrasound. An ultrasound image was saved and sent to PACS. Over a guidewire, the IVC filter delivery sheath and inner dilator were advanced into the IVC just above the IVC bifurcation. Contrast injection was performed for an IVC venogram. Through the delivery sheath, a retrievable Denali IVC filter was deployed below the level of the renal veins and above the IVC bifurcation. Limited post deployment venacavagram was performed. The delivery sheath was removed and hemostasis was obtained with manual compression. A dressing was placed. The patient tolerated the procedure well without immediate post procedural complication. FINDINGS: The IVC is patent. No evidence of thrombus, stenosis, or occlusion. No  variant venous anatomy. Successful placement of the IVC filter below the level of the renal veins. IMPRESSION: Successful ultrasound and fluoroscopically guided placement of an infrarenal  retrievable IVC filter via right jugular approach. PLAN: Due to patient related comorbidities and/or clinical necessity, this IVC filter should be considered a permanent device. This patient will not be actively followed for future filter retrieval. Signed, Criselda Peaches, MD, [facility] Radiology Electronically Signed   By: Jacqulynn Cadet M.D.   On: 10/30/2018 17:39    Medications: I have reviewed the patient's current medications.  Assessment:   1. LGI Bleed. Probably diverticular. Appears to have stopped. IVC filter in place 2. PE. IVC filter being transitioned to oral anticoagulation   Plan: We will sign off and be available for any further problems.   Nancy Fetter 10/31/2018, 9:14 AM  This note was created using voice recognition software. Minor errors may Have occurred unintentionally.  Pager: [phone] If no answer or after hours call [phone]

## 2018-11-01 LAB — GLUCOSE, CAPILLARY: Glucose-Capillary: 177 mg/dL — ABNORMAL HIGH (ref 70–99)

## 2018-11-23 NOTE — Progress Notes (Signed)
Sats up to 89% but, pt lethargic. Tele called and states pt is VT. Resp at bedside and Charge nurse checked CBG(177).

## 2018-11-23 NOTE — Progress Notes (Signed)
Called to room by co-workers saying that pt was c/o SOB and Chest pain. Upon entering the room the pt was very agitated and and squirming around in bed saying he cant breath. Sats was 78% on 3L. O2 turned to 6L [place]. Pt very clammy. Tele called and states the pts HR is in the 30's. BLennox Grumbles notified, Resp called and RR called.

## 2018-11-23 NOTE — Progress Notes (Signed)
Daughter at bedside, support given. She wishes to call other family members. Unsure of what funeral home will be used since she is not from this area. Request that we let her wait til morning to call a minster that cared for her father to ask for guidance. She will call in am with information.

## 2018-11-23 NOTE — Progress Notes (Signed)
Scott Harper called time of death at 40. House supervisor notified and she called daughter. Daughter on her way to hospital.

## 2018-11-23 NOTE — Progress Notes (Signed)
   11-16-2018 0200  MEWS Assessment  Is this an acute change? Yes  B. Kyere notified of HR dropped to 38

## 2018-11-23 NOTE — Progress Notes (Signed)
Pt showing astyole on the monitor, only faint pulse found. RR, Resp, and Charge nurse at bedside. Lorra Hals notified of above.

## 2018-11-23 NOTE — Death Summary Note (Signed)
DEATH SUMMARY   Patient Details  Name: Scott Harper MRN: [id number] DOB: 23-May-1940  Admission/Discharge Information   Admit Date:  2018/11/12  Date of Death: Date of Death: 11/17/2018  Time of Death: Time of Death: 0245  Length of Stay: 4  Referring Physician: Patient, No Pcp Per   Reason(s) for Hospitalization     Diagnoses  Preliminary cause of death: Metastatic colon cancer to liver [facility]) Secondary Diagnoses (including complications and co-morbidities):  Principal Problem:   Pulmonary embolism ([place]) Active Problems:   Metastatic cancer ([place])   Hospice care patient   GI bleed   Acute blood loss anemia   Metastatic colon cancer to liver ([place])   Acute deep vein thrombosis (DVT) of femoral vein of left lower extremity ([place])   Acute lower GI bleeding   Brief Hospital Course (including significant findings, care, treatment, and services provided and events leading to death)  Scott Harper is a 78 y.o. year old malewith medical history significant ofmetastatic colon cancer status post right hemicolectomy and currently on hospice and no longer receiving any active treatment presenting to the hospital for evaluation of shortness of breath. Patient was admitted to the hospital last month for acute bilateral PE and possible pneumonia versus pulmonary infarct. He was started on Eliquis.  Patient reports having shortness of breath for the past few weeks. Denies cough. States he has noticed blood in his stool since he started taking Eliquis. He has not vomited any blood. He has also been having epigastric abdominal pain. Denies history of prior GI bleed. States he stopped taking Eliquis 4 to 5 days ago as he was noticing blood in his stool. No additional history could be obtained from him.  ED Course:Not hypoxic, placed on nonrebreather by EMS for comfort. Blood pressure soft. Slightly tachypneic. Hemoglobin 8.3, was 11.3 on 09/28/2018. BNP normal.  High-sensitivity troponin 108.EKG not suggestive of ACS. FOBT positive. SARS-CoV-2 test pending. Chest x-ray showing no active disease. Patient received 1 L normal saline bolus. 1 unit PRBCs ordered.  Metastatic colon cancer with liver metastasis Patient with history of metastatic colon cancer status post right hemicolectomy with metastasis to the liver.  Patient is followed by [facility], Dr. Estelle Grumbles.  Last seen in office on 07/18/2018 with repeat CT scan at that time showing new and a large liver lesions with recommendations of hospice.  Palliative care was consulted and followed during the hospital course.  Patient was being readied for discharge to home with home hospice services when he unfortunately continued to gradually decline with significant hypotension, bradycardia, and hypoxia 11-17-18 and ultimately passed at 0245 am.  Patient's daughter was notified by the house supervisor of her father's passing.  Subacute bilateral pulmonary embolism with evidence of worsening right heart strain Pulmonary infarct Elevated troponin, likely demand ischemia from right heart strain secondary to PE Recently discharged on 09/26/2018 for acute bilateral PE in the setting of metastatic cancer, and discharged on Eliquis.He has failed Eliquis as repeat CT angiogram showing bilateral PE and evidence of worsening right heart strain (RV to LV ratio of 2.2). There is evidence of new emboli and clot propagation from older emboli. Wedge-shaped area of opacity in the lingula, likely an infarct. High-sensitivity troponin elevated in the setting of right heart strain (108 >197 >277). EKG without acute ischemic changes. Difficult situation as patient has also been having intermittent hematochezia since he was started on Eliquisand hemoglobin with 3 g drop compared to labs done during hospitalization a month ago.  FOBT positive and stool mixed with blood on exam done by ED provider. Case was discussed with  Dr. Ronnette Juniper from PCCM and Dr. Laurence Ferrari with IR by admitting provider; and was deemed patient not a candidate for thrombolytics given active GI bleed. The only option at this point is trying unfractionated heparin at a low dose and monitoring closely for worsening GI bleed. Echocardiogram with EF [id number], diastolic dysfunction, RV pressure overload, mild RV reduced function, positive McConnell sign, RA moderately dilated, mild TR, normal IVC.    Patient was started on IV heparin drip and monitored for several days with stabilization of his hemoglobin and was transitioned to treatment dose Lovenox; but unfortunately he continued to rapidly decline based on his metastatic disease burden and ultimately passed away 77-Oct-2020.  Acute blood loss anemia secondary to acute GI bleed, likely lower Patient presenting with shortness of breath.  Hemoglobin on admission noted to be 8.3, which is a 3 g drop since 09/28/2018 with hemoglobin 11.3.  Recently started on Eliquis for PE as above.  FOBT positive and stool mixed with blood on exam during admission. Etiology likely secondary to anticoagulation and further complicated by his history of metastatic colorectal cancer, presum lower GIB at this point as he initially stated some of bright red blood in his stool.  Gastroenterology was consulted.  Etiology likely diverticular bleed which has stabilized.  Patient did receive 1 unit PRBCs on 77/06/2018.  Continued on PPI twice daily, but ultimately poor prognosis and passed away on 77-12-20.  Acute left lower extremity DVT Lower externally venous duplex scan notable for acute DVT in the left common femoral,, left femoral, left popliteal, left posterior tibial veins.  Of which this is a new finding since last venous duplex scan back in early September 2020.  This is another confirmation that he has failed outpatient Eliquis therapy.  Started on heparin drip as above with transition to Lovenox.     Pertinent Labs and  Studies  Significant Diagnostic Studies Ct Angio Chest Pe W Or Wo Contrast  Result Date: 77/06/2018 CLINICAL DATA:  Shortness of breath and gastrointestinal bleed. EXAM: CT ANGIOGRAPHY CHEST CT ABDOMEN AND PELVIS WITH CONTRAST TECHNIQUE: Multidetector CT imaging of the chest was performed using the standard protocol during bolus administration of intravenous contrast. Multiplanar CT image reconstructions and MIPs were obtained to evaluate the vascular anatomy. Multidetector CT imaging of the abdomen and pelvis was performed using the standard protocol during bolus administration of intravenous contrast. CONTRAST:  27mL OMNIPAQUE IOHEXOL 350 MG/ML SOLN COMPARISON:  CTA chest 09/26/2018 FINDINGS: CTA CHEST FINDINGS Cardiovascular: Contrast injection is sufficient to demonstrate satisfactory opacification of the pulmonary arteries to the segmental level.There are multiple bilateral pulmonary artery filling defects. There are new filling defects that are proximal to the previously demonstrated emboli in the middle and lower lobes. Emboli in the right upper lobe appear to be new. On the left, there are new filling defects in the upper lobe and increase in the clot burden in the left lower lobe. The main pulmonary artery is within normal limits for size. There is CT evidence of right heart strain with an RV to LV ratio of 2.2 . there is mild aortic atherosclerosis there is a normal 3-vessel arch branching pattern. Heart size is mildly enlarged. No pericardial effusion. Mediastinum/Nodes: No mediastinal, hilar or axillary lymphadenopathy. The visualized thyroid and thoracic esophageal course are unremarkable. Lungs/Pleura: There are innumerable bilateral nodular opacities. There is fluid along the right major fissure. There is a wedge-shaped  area of opacity in the lingula that likely indicates an infarct. There are multiple pleural-based nodules within both lungs. Musculoskeletal: No chest wall abnormality. No acute or  significant osseous findings. Review of the MIP images confirms the above findings. CT ABDOMEN and PELVIS FINDINGS Hepatobiliary: Numerous hypoattenuating hepatic masses, the largest of which measures 3.6 cm. Normal gallbladder. Pancreas: Normal contours without ductal dilatation. No peripancreatic fluid collection. Spleen: Normal. Adrenals/Urinary Tract: --Adrenal glands: Normal. --Right kidney/ureter: No hydronephrosis or perinephric stranding. No nephrolithiasis. No obstructing ureteral stones. --Left kidney/ureter: Multiple renal cyst measuring up to 28 mm. --Urinary bladder: Unremarkable. Stomach/Bowel: --Stomach/Duodenum: No hiatal hernia or other gastric abnormality. Normal duodenal course and caliber. --Small bowel: No dilatation or inflammation. --Colon: There is a anastomosis at the transverse colon. No other focal colonic abnormality is visible. --Appendix: Not visualized. No right lower quadrant inflammation or free fluid. Vascular/Lymphatic: Atherosclerotic calcification is present within the non-aneurysmal abdominal aorta, without hemodynamically significant stenosis. No abdominal or pelvic lymphadenopathy. Reproductive: Mildly enlarged prostate Musculoskeletal. No bony spinal canal stenosis or focal osseous abnormality. Other: None. IMPRESSION: 1. Bilateral pulmonary emboli with CT evidence of right heart strain (RV to LV ratio of 2.2). The emboli most likely represent a combination of new emboli and clot propagation from older emboli. 2. Innumerable bilateral pulmonary parenchymal and pleural based nodules, consistent with metastatic disease. 3. Wedge-shaped area of opacity in the lingula, likely an infarct. 4. Numerous liver metastases. 5. Aortic Atherosclerosis (ICD10-I70.0). Critical Value/emergent results were called by telephone at the time of interpretation on 10/28/2018 at 2:53 am to Dr. Shela Leff , who verbally acknowledged these results. Electronically Signed   By: Ulyses Jarred M.D.    On: 10/28/2018 02:53   Ct Abdomen Pelvis W Contrast  Result Date: 77/06/2018 CLINICAL DATA:  Shortness of breath and gastrointestinal bleed. EXAM: CT ANGIOGRAPHY CHEST CT ABDOMEN AND PELVIS WITH CONTRAST TECHNIQUE: Multidetector CT imaging of the chest was performed using the standard protocol during bolus administration of intravenous contrast. Multiplanar CT image reconstructions and MIPs were obtained to evaluate the vascular anatomy. Multidetector CT imaging of the abdomen and pelvis was performed using the standard protocol during bolus administration of intravenous contrast. CONTRAST:  63mL OMNIPAQUE IOHEXOL 350 MG/ML SOLN COMPARISON:  CTA chest 09/26/2018 FINDINGS: CTA CHEST FINDINGS Cardiovascular: Contrast injection is sufficient to demonstrate satisfactory opacification of the pulmonary arteries to the segmental level.There are multiple bilateral pulmonary artery filling defects. There are new filling defects that are proximal to the previously demonstrated emboli in the middle and lower lobes. Emboli in the right upper lobe appear to be new. On the left, there are new filling defects in the upper lobe and increase in the clot burden in the left lower lobe. The main pulmonary artery is within normal limits for size. There is CT evidence of right heart strain with an RV to LV ratio of 2.2 . there is mild aortic atherosclerosis there is a normal 3-vessel arch branching pattern. Heart size is mildly enlarged. No pericardial effusion. Mediastinum/Nodes: No mediastinal, hilar or axillary lymphadenopathy. The visualized thyroid and thoracic esophageal course are unremarkable. Lungs/Pleura: There are innumerable bilateral nodular opacities. There is fluid along the right major fissure. There is a wedge-shaped area of opacity in the lingula that likely indicates an infarct. There are multiple pleural-based nodules within both lungs. Musculoskeletal: No chest wall abnormality. No acute or significant osseous  findings. Review of the MIP images confirms the above findings. CT ABDOMEN and PELVIS FINDINGS Hepatobiliary: Numerous hypoattenuating hepatic masses,  the largest of which measures 3.6 cm. Normal gallbladder. Pancreas: Normal contours without ductal dilatation. No peripancreatic fluid collection. Spleen: Normal. Adrenals/Urinary Tract: --Adrenal glands: Normal. --Right kidney/ureter: No hydronephrosis or perinephric stranding. No nephrolithiasis. No obstructing ureteral stones. --Left kidney/ureter: Multiple renal cyst measuring up to 28 mm. --Urinary bladder: Unremarkable. Stomach/Bowel: --Stomach/Duodenum: No hiatal hernia or other gastric abnormality. Normal duodenal course and caliber. --Small bowel: No dilatation or inflammation. --Colon: There is a anastomosis at the transverse colon. No other focal colonic abnormality is visible. --Appendix: Not visualized. No right lower quadrant inflammation or free fluid. Vascular/Lymphatic: Atherosclerotic calcification is present within the non-aneurysmal abdominal aorta, without hemodynamically significant stenosis. No abdominal or pelvic lymphadenopathy. Reproductive: Mildly enlarged prostate Musculoskeletal. No bony spinal canal stenosis or focal osseous abnormality. Other: None. IMPRESSION: 1. Bilateral pulmonary emboli with CT evidence of right heart strain (RV to LV ratio of 2.2). The emboli most likely represent a combination of new emboli and clot propagation from older emboli. 2. Innumerable bilateral pulmonary parenchymal and pleural based nodules, consistent with metastatic disease. 3. Wedge-shaped area of opacity in the lingula, likely an infarct. 4. Numerous liver metastases. 5. Aortic Atherosclerosis (ICD10-I70.0). Critical Value/emergent results were called by telephone at the time of interpretation on 10/28/2018 at 2:53 am to Dr. Shela Leff , who verbally acknowledged these results. Electronically Signed   By: Ulyses Jarred M.D.   On: 10/28/2018  02:53   Ir Ivc Filter Plmt / S&i /img Guid/mod Sed  Result Date: 10/30/2018 INDICATION: 78 year old male with a history of pulmonary emboli previously on anticoagulation. Unfortunately, he has developed a gastrointestinal bleed requiring transfusion and cessation of anticoagulation. CT imaging demonstrates new/worsening pulmonary emboli. Therefore, he presents for placement of an IVC filter. He has known malignancy and is on hospice. Therefore, this IVC filter will be considered permanent and not followed for retrieval. EXAM: ULTRASOUND GUIDANCE FOR VASCULARACCESS IVC CATHETERIZATION AND VENOGRAM IVC FILTER INSERTION Interventional Radiologist:  Criselda Peaches, MD MEDICATIONS: None. ANESTHESIA/SEDATION: Fentanyl 100 mcg IV; Versed 2 mg IV Moderate Sedation Time:  11 minutes The patient was continuously monitored during the procedure by the interventional radiology nurse under my direct supervision. FLUOROSCOPY TIME:  Fluoroscopy Time: 0 minutes 30 seconds (22 mGy). COMPLICATIONS: None immediate. PROCEDURE: Informed written consent was obtained from the patient after a thorough discussion of the procedural risks, benefits and alternatives. All questions were addressed. Maximal Sterile Barrier Technique was utilized including caps, mask, sterile gowns, sterile gloves, sterile drape, hand hygiene and skin antiseptic. A timeout was performed prior to the initiation of the procedure. Maximal barrier sterile technique utilized including caps, mask, sterile gowns, sterile gloves, large sterile drape, hand hygiene, and Betadine prep. Under sterile condition and local anesthesia, right internal jugular venous access was performed with ultrasound. An ultrasound image was saved and sent to PACS. Over a guidewire, the IVC filter delivery sheath and inner dilator were advanced into the IVC just above the IVC bifurcation. Contrast injection was performed for an IVC venogram. Through the delivery sheath, a retrievable  [place] IVC filter was deployed below the level of the renal veins and above the IVC bifurcation. Limited post deployment venacavagram was performed. The delivery sheath was removed and hemostasis was obtained with manual compression. A dressing was placed. The patient tolerated the procedure well without immediate post procedural complication. FINDINGS: The IVC is patent. No evidence of thrombus, stenosis, or occlusion. No variant venous anatomy. Successful placement of the IVC filter below the level of the renal veins. IMPRESSION:  Successful ultrasound and fluoroscopically guided placement of an infrarenal retrievable IVC filter via right jugular approach. PLAN: Due to patient related comorbidities and/or clinical necessity, this IVC filter should be considered a permanent device. This patient will not be actively followed for future filter retrieval. Signed, Criselda Peaches, MD, [facility] Electronically Signed   By: Jacqulynn Cadet M.D.   On: 10/30/2018 17:39   Dg Chest Port 1 View  Result Date: 10/30/2018 CLINICAL DATA:  Shortness of breath EXAM: PORTABLE CHEST 1 VIEW COMPARISON:  09/26/2018 FINDINGS: The heart size and mediastinal contours are within normal limits. Both lungs are clear. The visualized skeletal structures are unremarkable. There is a right chest wall power-injectable Port-A-Cath with tip in the lower SVC via a right internal jugular vein approach. IMPRESSION: No active disease. Electronically Signed   By: Ulyses Jarred M.D.   On: 10/25/2018 21:07   Vas [place] Lower Extremity Venous (dvt)  Result Date: 10/29/2018  Lower Venous Study Indications: Pulmonary embolism.  Comparison Study: previous study done 09/27/18 Performing Technologist: Abram Sander RVS  Examination Guidelines: A complete evaluation includes B-mode imaging, spectral Doppler, color Doppler, and power Doppler as needed of all accessible portions of each  vessel. Bilateral testing is considered an integral part of a complete examination. Limited examinations for reoccurring indications may be performed as noted.  +---------+---------------+---------+-----------+----------+--------------+ RIGHT    CompressibilityPhasicitySpontaneityPropertiesThrombus Aging +---------+---------------+---------+-----------+----------+--------------+ CFV      Full           Yes      Yes                                 +---------+---------------+---------+-----------+----------+--------------+ SFJ      Full                                                        +---------+---------------+---------+-----------+----------+--------------+ FV Prox  Full                                                        +---------+---------------+---------+-----------+----------+--------------+ FV Mid   Full                                                        +---------+---------------+---------+-----------+----------+--------------+ FV DistalFull                                                        +---------+---------------+---------+-----------+----------+--------------+ PFV      Full                                                        +---------+---------------+---------+-----------+----------+--------------+  POP      Full           Yes      Yes                                 +---------+---------------+---------+-----------+----------+--------------+ PTV      Full                                                        +---------+---------------+---------+-----------+----------+--------------+ PERO                                                  Not visualized +---------+---------------+---------+-----------+----------+--------------+   +---------+---------------+---------+-----------+----------+--------------+ LEFT     CompressibilityPhasicitySpontaneityPropertiesThrombus Aging  +---------+---------------+---------+-----------+----------+--------------+ CFV      None           No       No                                  +---------+---------------+---------+-----------+----------+--------------+ SFJ      Full                                                        +---------+---------------+---------+-----------+----------+--------------+ FV Prox  None                                                        +---------+---------------+---------+-----------+----------+--------------+ FV Mid   None                                                        +---------+---------------+---------+-----------+----------+--------------+ FV DistalNone                                                        +---------+---------------+---------+-----------+----------+--------------+ PFV      None                                                        +---------+---------------+---------+-----------+----------+--------------+ POP      None           No       No                                  +---------+---------------+---------+-----------+----------+--------------+  PTV      None                                                        +---------+---------------+---------+-----------+----------+--------------+ PERO                                                  Not visualized +---------+---------------+---------+-----------+----------+--------------+     Summary: Right: There is no evidence of deep vein thrombosis in the lower extremity. No cystic structure found in the popliteal fossa. Left: Findings consistent with acute deep vein thrombosis involving the left common femoral vein, left femoral vein, left proximal profunda vein, left popliteal vein, and left posterior tibial veins. No cystic structure found in the popliteal fossa.  *See table(s) above for measurements and observations. Electronically signed by Curt Jews MD on 10/29/2018 at  4:39:56 PM.    Final     Microbiology No results found for this or any previous visit (from the past 240 hour(s)).  Lab Basic Metabolic Panel: No results for input(s): NA, K, CL, CO2, GLUCOSE, BUN, CREATININE, CALCIUM, MG, PHOS in the last 168 hours. Liver Function Tests: No results for input(s): AST, ALT, ALKPHOS, BILITOT, PROT, ALBUMIN in the last 168 hours. No results for input(s): LIPASE, AMYLASE in the last 168 hours. No results for input(s): AMMONIA in the last 168 hours. CBC: No results for input(s): WBC, NEUTROABS, HGB, HCT, MCV, PLT in the last 168 hours. Cardiac Enzymes: No results for input(s): CKTOTAL, CKMB, CKMBINDEX, TROPONINI in the last 168 hours. Sepsis Labs: No results for input(s): PROCALCITON, WBC, LATICACIDVEN in the last 168 hours.  Procedures/Operations     Kent Braunschweig J [place] 11/12/2018, 4:13 PM

## 2018-11-23 DEATH — deceased

## 2019-11-15 IMAGING — XA IR IVC FILTER PLMT / S&I /IMG GUID/MOD SED
3 series · 9 of 9 positions shown · IV contrast (IODINE)
Comparison: none

INDICATION: 78-year-old male with a history of pulmonary emboli previously on
anticoagulation. Unfortunately, he has developed a gastrointestinal
bleed requiring transfusion and cessation of anticoagulation. CT
imaging demonstrates new/worsening pulmonary emboli. Therefore, he
presents for placement of an IVC filter. He has known malignancy and
is on hospice. Therefore, this IVC filter will be considered
permanent and not followed for retrieval.

[Series 1: care body 4 · 4 of 26 frames shown (1 of 2)]
[frame 4/26]
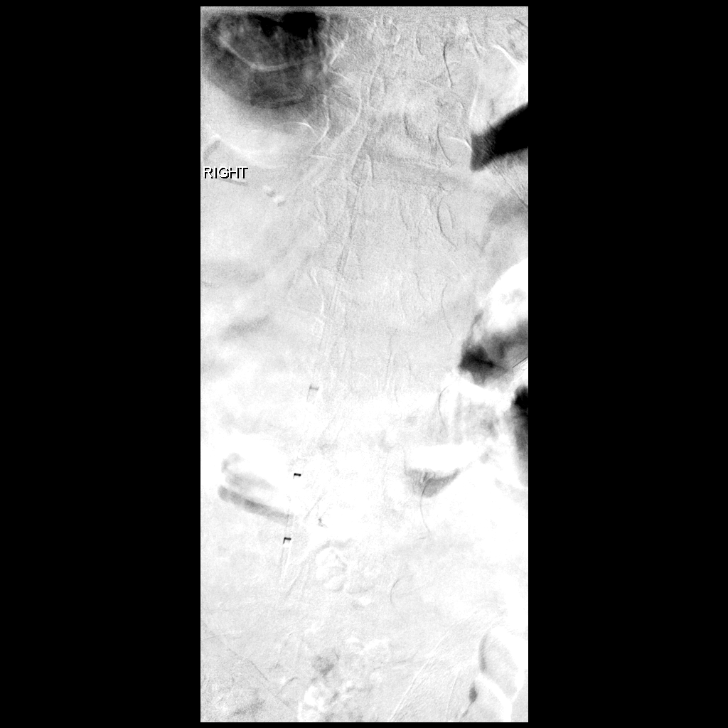
[frame 14/26]
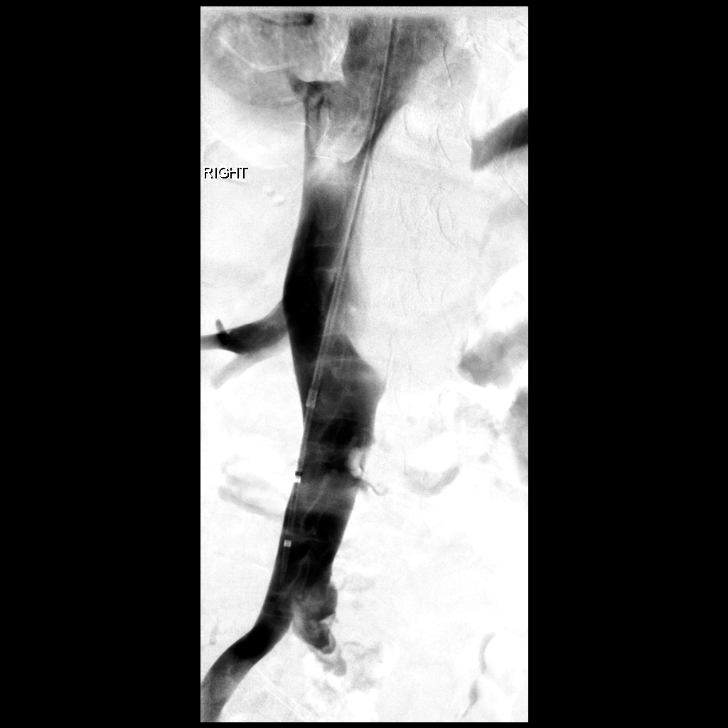
[frame 22/26]
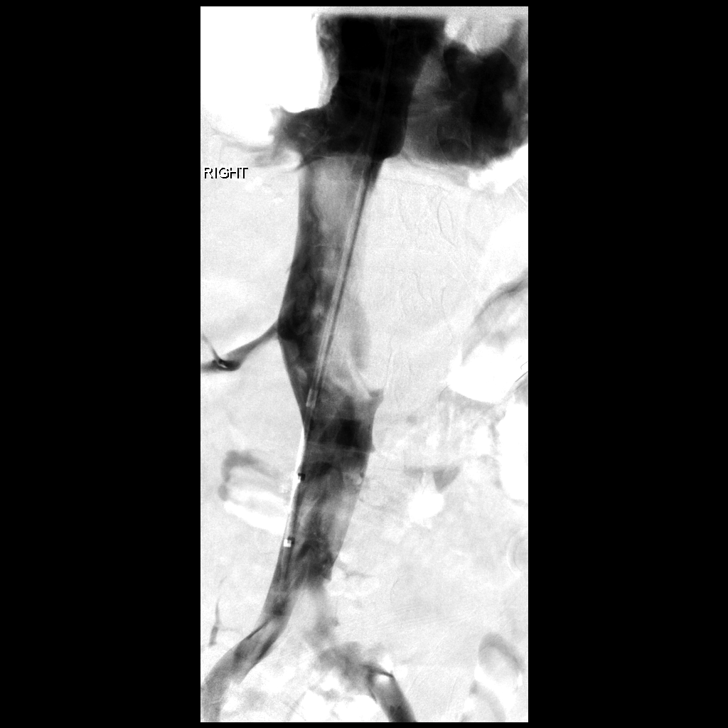
[frame 23/26]
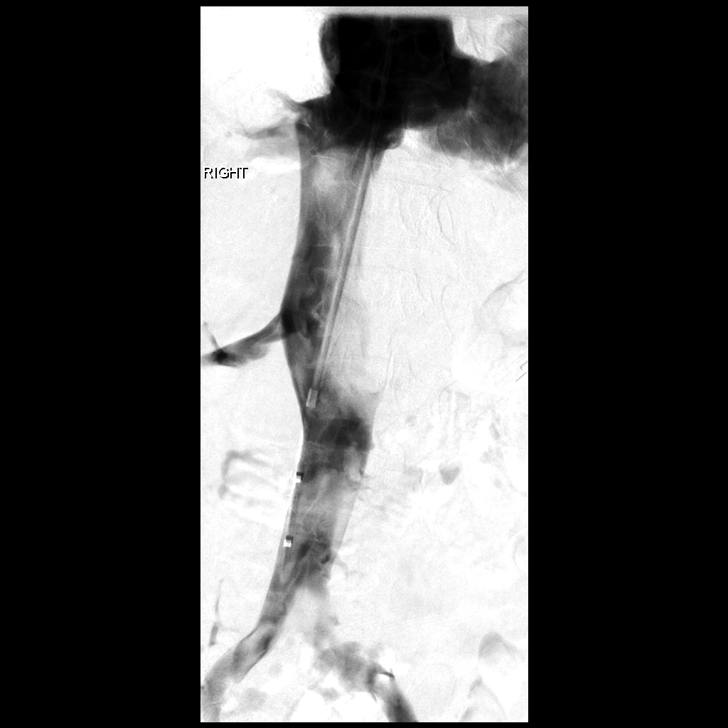

[Series 2: care body 4 · 4 of 17 frames shown (2 of 2)]
[frame 3/17]
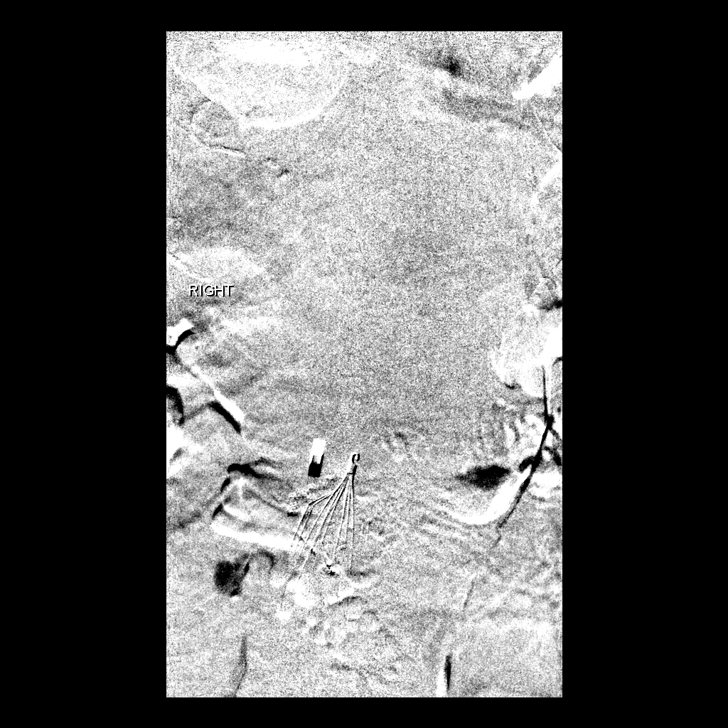
[frame 9/17]
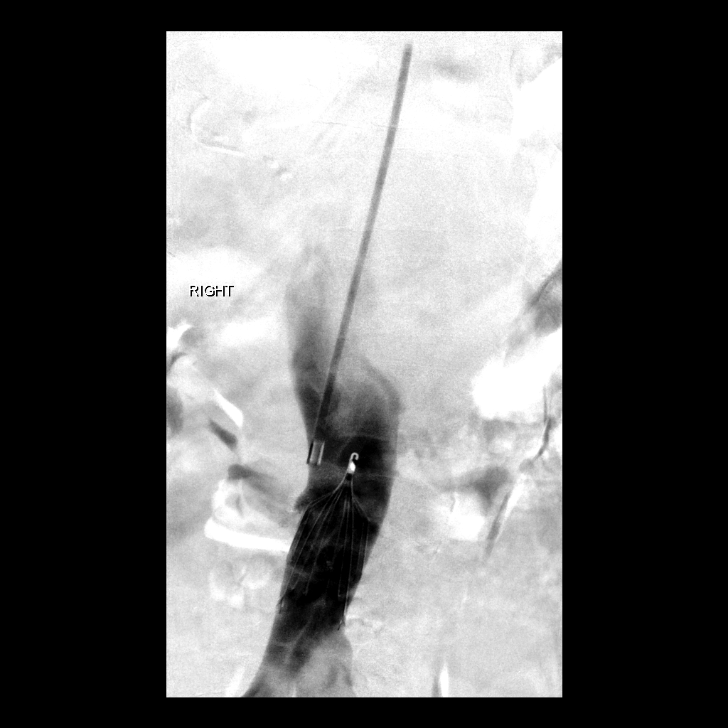
[frame 11/17]
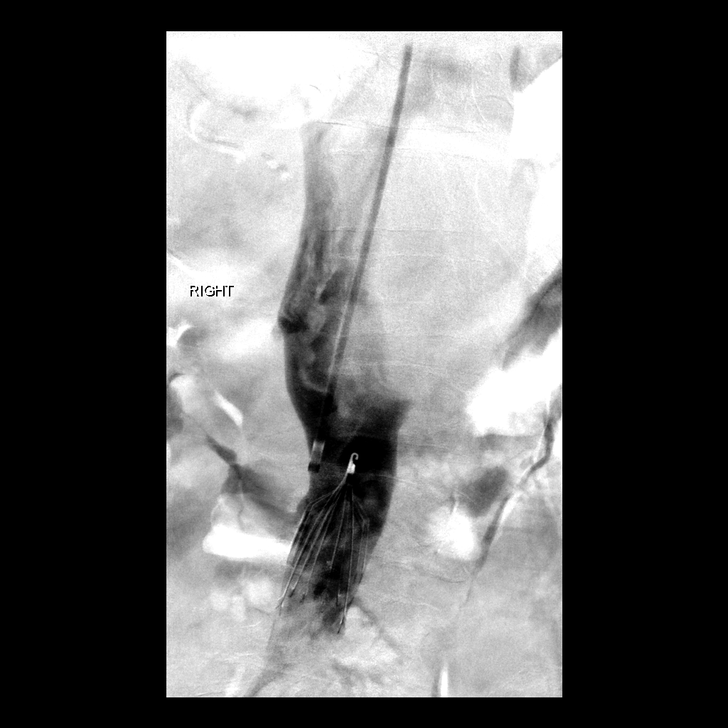
[frame 15/17]
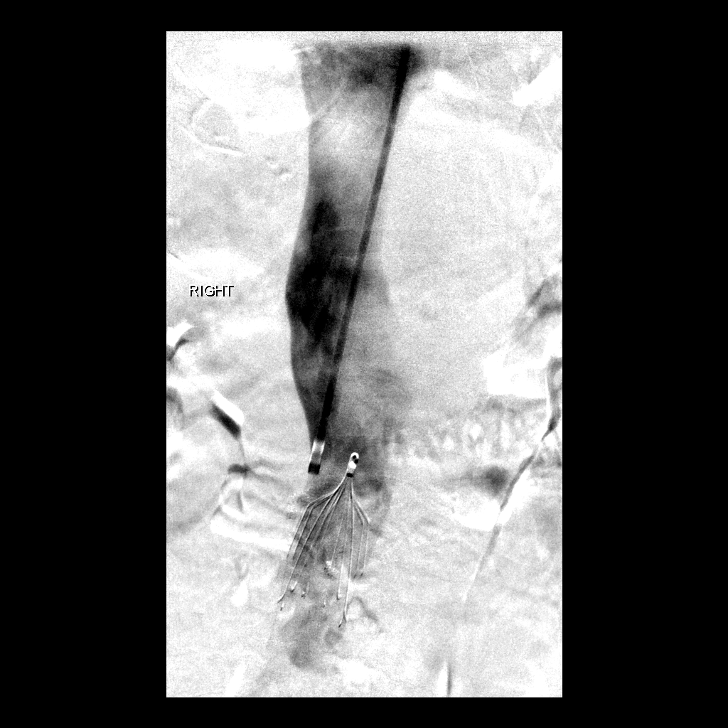

[Series 100: ir ivc filter plmt / s &i /img guid/mod  · 1 of 1 slices shown]
[im 1/1]
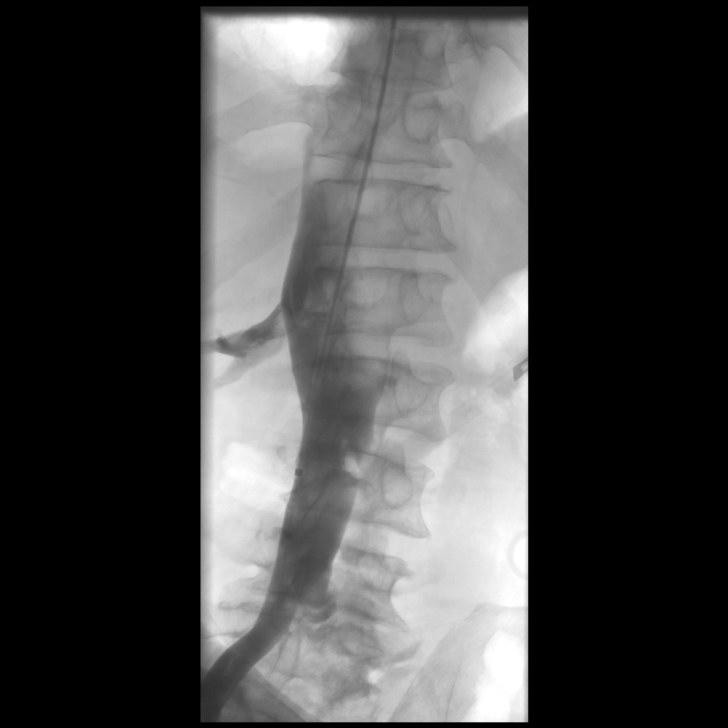

[9 of 9 positions shown; findings below may reference images not displayed]

EXAM:
ULTRASOUND GUIDANCE FOR VASCULARACCESS

IVC CATHETERIZATION AND VENOGRAM

IVC FILTER INSERTION

MEDICATIONS:
None.

ANESTHESIA/SEDATION:
Fentanyl 100 mcg IV; Versed 2 mg IV

Moderate Sedation Time:  11 minutes

The patient was continuously monitored during the procedure by the
interventional radiology nurse under my direct supervision.

FLUOROSCOPY TIME:  Fluoroscopy Time: 0 minutes 30 seconds (22 mGy).

COMPLICATIONS:
None immediate.

PROCEDURE:
Informed written consent was obtained from the patient after a
thorough discussion of the procedural risks, benefits and
alternatives. All questions were addressed. Maximal Sterile Barrier
Technique was utilized including caps, mask, sterile gowns, sterile
gloves, sterile drape, hand hygiene and skin antiseptic. A timeout
was performed prior to the initiation of the procedure.

Maximal barrier sterile technique utilized including caps, mask,
sterile gowns, sterile gloves, large sterile drape, hand hygiene,
and Betadine prep.

Under sterile condition and local anesthesia, right internal jugular
venous access was performed with ultrasound. An ultrasound image was
saved and sent to PACS. Over a guidewire, the IVC filter delivery
sheath and inner dilator were advanced into the IVC just above the
IVC bifurcation. Contrast injection was performed for an IVC
venogram.

Through the delivery sheath, a retrievable Denali IVC filter was
deployed below the level of the renal veins and above the IVC
bifurcation. Limited post deployment venacavagram was performed.

The delivery sheath was removed and hemostasis was obtained with
manual compression. A dressing was placed. The patient tolerated the
procedure well without immediate post procedural complication.
FINDINGS: The IVC is patent. No evidence of thrombus, stenosis, or occlusion.
No variant venous anatomy. Successful placement of the IVC filter
below the level of the renal veins.
IMPRESSION: Successful ultrasound and fluoroscopically guided placement of an
infrarenal retrievable IVC filter via right jugular approach.

PLAN:
Due to patient related comorbidities and/or clinical necessity, this
IVC filter should be considered a permanent device. This patient
will not be actively followed for future filter retrieval.
